# Patient Record
Sex: Female | Born: 1995 | Race: Black or African American | Hispanic: No | Marital: Single | Smoking: Never smoker
Health system: Southern US, Community
[De-identification: ages and names within clinical notes are randomized; demographics above are authoritative.]

## PROBLEM LIST (undated history)

## (undated) ENCOUNTER — Ambulatory Visit: Disposition: A | Discharge status: Other Acute Inpatient Hospital | Payer: Medicaid Other

## (undated) DIAGNOSIS — N920 Excessive and frequent menstruation with regular cycle: Secondary | ICD-10-CM

## (undated) DIAGNOSIS — N946 Dysmenorrhea, unspecified: Secondary | ICD-10-CM

## (undated) DIAGNOSIS — E669 Obesity, unspecified: Secondary | ICD-10-CM

## (undated) DIAGNOSIS — E66811 Obesity, class 1: Secondary | ICD-10-CM

## (undated) DIAGNOSIS — K219 Gastro-esophageal reflux disease without esophagitis: Secondary | ICD-10-CM

## (undated) HISTORY — DX: Dysmenorrhea, unspecified: N94.6

## (undated) HISTORY — DX: Obesity, unspecified: E66.9

## (undated) HISTORY — DX: Obesity, class 1: E66.811

## (undated) HISTORY — PX: WRIST SURGERY: SHX841

## (undated) HISTORY — DX: Gastro-esophageal reflux disease without esophagitis: K21.9

## (undated) HISTORY — DX: Excessive and frequent menstruation with regular cycle: N92.0

---
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
## 2016-08-02 ENCOUNTER — Ambulatory Visit (INDEPENDENT_AMBULATORY_CARE_PROVIDER_SITE_OTHER): Payer: Medicaid Other | Admitting: Certified Nurse Midwife

## 2016-08-02 ENCOUNTER — Encounter: Payer: Self-pay | Admitting: Certified Nurse Midwife

## 2016-08-02 VITALS — BP 110/70 | HR 83 | Ht 63.0 in | Wt 184.0 lb

## 2016-08-02 DIAGNOSIS — N921 Excessive and frequent menstruation with irregular cycle: Secondary | ICD-10-CM

## 2016-08-02 DIAGNOSIS — B9689 Other specified bacterial agents as the cause of diseases classified elsewhere: Secondary | ICD-10-CM | POA: Diagnosis not present

## 2016-08-02 DIAGNOSIS — B3731 Acute candidiasis of vulva and vagina: Secondary | ICD-10-CM

## 2016-08-02 DIAGNOSIS — R351 Nocturia: Secondary | ICD-10-CM

## 2016-08-02 DIAGNOSIS — N939 Abnormal uterine and vaginal bleeding, unspecified: Secondary | ICD-10-CM

## 2016-08-02 DIAGNOSIS — N760 Acute vaginitis: Secondary | ICD-10-CM

## 2016-08-02 DIAGNOSIS — N920 Excessive and frequent menstruation with regular cycle: Secondary | ICD-10-CM | POA: Insufficient documentation

## 2016-08-02 DIAGNOSIS — N898 Other specified noninflammatory disorders of vagina: Secondary | ICD-10-CM | POA: Diagnosis not present

## 2016-08-02 DIAGNOSIS — R350 Frequency of micturition: Secondary | ICD-10-CM

## 2016-08-02 DIAGNOSIS — B373 Candidiasis of vulva and vagina: Secondary | ICD-10-CM | POA: Diagnosis not present

## 2016-08-02 LAB — POCT URINALYSIS DIPSTICK
Bilirubin, UA: NEGATIVE
GLUCOSE UA: NEGATIVE
Ketones, UA: NEGATIVE
NITRITE UA: POSITIVE
PH UA: 7
Protein, UA: NEGATIVE
RBC UA: NEGATIVE
Spec Grav, UA: 1.01
UROBILINOGEN UA: NEGATIVE

## 2016-08-02 LAB — POCT URINE PREGNANCY: PREG TEST UR: NEGATIVE

## 2016-08-02 MED ORDER — METRONIDAZOLE 500 MG PO TABS
500.0000 mg | ORAL_TABLET | Freq: Two times a day (BID) | ORAL | 0 refills | Status: AC
Start: 2016-08-02 — End: 2016-08-09

## 2016-08-02 MED ORDER — FLUCONAZOLE 150 MG PO TABS: 150.0000 mg | ORAL_TABLET | ORAL | 0 refills | Status: AC

## 2016-08-02 NOTE — Progress Notes (Signed)
Subjective:   HPI  Yvonne Herrera is a 21 y.o. femaleG0 who presents with complaints of  Bleeding all the time since her Nexplanon was replaced in Dec 2016. Her bleeding is heavy at times requiring her to change her pad q1hour. The bleeding is accompanied by cramping and she has started to have cramping when she doesn't bleed. Ibuprofen does not help cramping.Has noticed a thick yellowish discharge when not bleeding. Has had some itching of vulva. Also complains of urinary frequency and nocturia "10 times/night." Denies dysuria. Denies drinking much fluid at night before bedtime.   Gynecologic History Patient's last menstrual period was 04/04/2016 (approximate). Contraception: Nexplanon. Has had the same sex partner for years.  Last Pap:  NA. Because of age  Obstetric History OB History  Gravida Para Term Preterm AB Living  0 0 0 0 0 0  SAB TAB Ectopic Multiple Live Births  0 0 0 0         Past Medical HX: No chronic medical illnesses Past Surgical Hx: No past surgery Social Hx: non smoker, does not drink alcohol Family history: MGM with colon cancer   Review of Systems   Review of Systems  Constitutional: Negative for fever.  Gastrointestinal: Positive for abdominal pain. Negative for constipation, diarrhea, nausea and vomiting.  Genitourinary: Positive for frequency. Negative for dysuria.       Positive for nocturia, vaginal discharge, metrorrhagia and menorrhagia  Neurological: Negative for weakness.      Objective:    BP 110/70   Pulse 83   Ht 5\' 3"  (1.6 m)   Wt 184 lb (83.5 kg)   LMP 04/04/2016 (Approximate) Comment: pt states she bled for 3 months  BMI 32.59 kg/m  General appearance: alert, cooperative and no distress Eyes: conjuctiva pink  Neck: no thyroid enlargement Abdomen: soft, non-tender; bowel sounds normal; no masses,  no organomegaly Pelvic: Vulva: labia majora appears irritated/ inflamed Vagina: white thin homogenous discharge with odor, vaginal  mucosa appears inflamed Cervix/ nullip, inflamed Uterus: RV, nssc, NT, mobility decreased Adnexa:no masses, NT Skin: small bruise above her Nexplanon inthe upper left arms   Wet prep was positive for clue cells and hyphae; neg for Trich UA dip positive for nitrites and small leukocytes Pregnancy test negative    Assessment:   Menometrorrhagia on the Nexplanon-R/O GC/Chlamydia Bacterial vaginosis Monilial vulvovaginitis Urinary frequency/nocturia   Plan:   Aptima Urine culture Given 2 samples of Taytulla to take continuously to help stabilize endometrium. Couseled on possible side effects, how to take pills, and risks of thromboembolism Flagyl 500 mgm po BID x7d ( no alcohol) Diflucan 150 mgm - take one every 3 days x2 doses Will call with positive lab result RTO in 2 months

## 2016-08-03 LAB — POCT WET PREP (WET MOUNT)
TRICHOMONAS WET PREP HPF POC: ABSENT
pH: 11

## 2016-08-03 LAB — GC/CHLAMYDIA PROBE AMP
CHLAMYDIA, DNA PROBE: NEGATIVE
Neisseria gonorrhoeae by PCR: NEGATIVE

## 2016-08-09 LAB — URINE CULTURE

## 2016-08-11 ENCOUNTER — Other Ambulatory Visit: Payer: Self-pay | Admitting: Certified Nurse Midwife

## 2016-08-11 DIAGNOSIS — N3000 Acute cystitis without hematuria: Secondary | ICD-10-CM

## 2016-08-11 MED ORDER — CIPROFLOXACIN HCL 500 MG PO TABS: 500.0000 mg | ORAL_TABLET | Freq: Two times a day (BID) | ORAL | 0 refills | Status: AC

## 2016-10-02 ENCOUNTER — Ambulatory Visit (INDEPENDENT_AMBULATORY_CARE_PROVIDER_SITE_OTHER): Payer: Medicaid Other | Admitting: Certified Nurse Midwife

## 2016-10-02 ENCOUNTER — Encounter: Payer: Self-pay | Admitting: Certified Nurse Midwife

## 2016-10-02 VITALS — BP 112/62 | HR 98 | Ht 65.0 in | Wt 187.0 lb

## 2016-10-02 DIAGNOSIS — Z3046 Encounter for surveillance of implantable subdermal contraceptive: Secondary | ICD-10-CM

## 2016-10-02 DIAGNOSIS — R350 Frequency of micturition: Secondary | ICD-10-CM

## 2016-10-02 DIAGNOSIS — Z3049 Encounter for surveillance of other contraceptives: Secondary | ICD-10-CM | POA: Diagnosis not present

## 2016-10-02 DIAGNOSIS — N921 Excessive and frequent menstruation with irregular cycle: Secondary | ICD-10-CM

## 2016-10-02 LAB — POCT URINALYSIS DIPSTICK
Bilirubin, UA: NEGATIVE
Glucose, UA: NEGATIVE
KETONES UA: NEGATIVE
Leukocytes, UA: NEGATIVE
Nitrite, UA: NEGATIVE
PH UA: 6 (ref 5.0–8.0)
UROBILINOGEN UA: NEGATIVE U/dL — AB

## 2016-10-02 MED ORDER — MEDROXYPROGESTERONE ACETATE 150 MG/ML IM SUSP: 150.0000 mg | INTRAMUSCULAR | 1 refills | Status: DC

## 2016-10-02 NOTE — Progress Notes (Signed)
Obstetrics & Gynecology Office Visit   Chief Complaint:  Chief Complaint  Patient presents with  . Follow-up    AUB    History of Present Illness: HPI  Yvonne Herrera is a 21 y.o. femaleG0 who presents with complaints of continued bleeding  since her Nexplanon was replaced in Dec 2016. She was seen [facility] and was placed on 2 months of Taytulla, but this has not stopped her bleeding.  Her bleeding is light to moderate flow, but at  times requires her to change her pad q1-2 hours. The bleeding is accompanied by cramping. She was treated for BV and monilia in March and her Chlamydia/gonorrhea cultures were negative at that time. She denies vaginitis sx today but has continued to have urinary frequency since being treated for a E.coli UTI in March.  Denies dysuria. Wants to have Nexplanon removed and is interested in another form of birth control.       Gynecologic History  Contraception: Nexplanon. Has had the same sex partner for years.  Last Pap:  NA. Because of age  Obstetric History        OB History  Gravida Para Term Preterm AB Living  0 0 0 0 0 0  SAB TAB Ectopic Multiple Live Births  0 0 0 0         Past Medical HX: No chronic medical illnesses Past Surgical Hx: No past surgery Social Hx: non smoker, does not drink alcohol Family history: MGM with colon cancer     Review of Systems:  Review of Systems  Constitutional: Negative for chills and fever.  Gastrointestinal: Positive for abdominal pain (cramping).  Genitourinary: Positive for frequency. Negative for dysuria.       See HPI     Past Medical History:  History reviewed. No pertinent past medical history.  Past Surgical History:  History reviewed. No pertinent surgical history.  Gynecologic History: No LMP recorded (lmp unknown). Patient has had an implant.  Obstetric History: G0P0000  Family History:  Family History  Problem Relation Age of Onset  . Colon cancer Maternal Grandmother      Social History:  Social History   Social History  . Marital status: Single    Spouse name: N/A  . Number of children: N/A  . Years of education: N/A   Occupational History  . Not on file.   Social History Main Topics  . Smoking status: Never Smoker  . Smokeless tobacco: Never Used  . Alcohol use No  . Drug use: No  . Sexual activity: Yes    Partners: Male    Birth control/ protection: Implant   Other Topics Concern  . Not on file   Social History Narrative  . No narrative on file    Allergies:  No Known Allergies  Medications: Prior to Admission medications   Medication Sig Start Date End Date Taking? Authorizing Provider  etonogestrel (NEXPLANON) 68 MG IMPL implant 1 each by Subdermal route once.    Historical Provider, MD    Physical Exam Vitals: BP 112/62   Pulse 98   Ht  (1.651 m)   Wt 84.8 kg (187 lb)   LMP  (LMP Unknown) Comment: pt has had continous bleeding with nexplanon  BMI 31.12 kg/m    Physical Exam  Constitutional: She is oriented to person, place, and time. She appears well-developed and well-nourished. No distress.  GI: Soft. She exhibits no mass. There is no tenderness. There is no guarding.  Genitourinary:  Genitourinary Comments: Vulva: no lesions or inflammation VAgina: small amt blood in vault Cervix: small amt bloody mucous at cervical os, closed, NT Uterus: Sharply RV, NT, difficult to outline but does not feel enlarged Adnexa: no masses, NT  Neurological: She is alert and oriented to person, place, and time.  Skin: Skin is warm and dry.  Psychiatric: She has a normal mood and affect. Her behavior is normal. Thought content normal.     Results for orders placed or performed in visit on 10/02/16 (from the past 48 hour(s))  POCT Urinalysis Dipstick     Status: Abnormal   Collection Time: 10/02/16 10:19 AM  Result Value Ref Range   Color, UA yellow    Clarity, UA cloudy    Glucose, UA neg    Bilirubin, UA neg     Ketones, UA neg    Spec Grav, UA >=1.030 (A) 1.010 - 1.025   Blood, UA large    pH, UA 6.0 5.0 - 8.0   Protein, UA trace    Urobilinogen, UA negative (A) 0.2 or 1.0 E.U./dL   Nitrite, UA neg    Leukocytes, UA Negative Negative    Assessment: 21 y.o. G0P0000 with almost continuous bleeding on the Nexplanon, not responsive to BCPs  Desires removal Contraceptive counseling Urinary frequency s/p treatment for UTI  Plan:   Nexplanon removal discussed in detail.  Risks of infection, bleeding, nerve injury all reviewed.  Patient understands risks and desires to proceed. Consent obtained and scanned into chart.  Patient is certain she wants the implanon removed.  All questions answered.  Procedure: Patient placed in dorsal supine with left arm above head, elbow flexed at 90 degrees, arm resting on examination table. Nexplanon identified without problems.  Betadine scrub x3.  1 ml of 2% lidocaine injected under implanon device without problems.  Sterile gloves applied.  Small 0.5cm incision made at distal tip of Nexplanon device with 11 blade scalpel. Nexplanon brought to incision with some difficulty and grasped with a small hemastat.  The implant was  removed intact without problems.  Pressure applied to incision.  Hemostasis obtained.  Steri-strips applied, followed by bandage and compression dressing.  Patient tolerated procedure well.  No complications.  Patient given post procedure precautions and asked to call for fever, chills, redness or drainage from her incision, bleeding from incision.  She understands she will likely have a small bruise near site of removal and can remove compression dressing tomorrow and steri-strips in approximately 5-7 week. Advised to change Band aid daily  2) Contraception-discussed all contraception options. She would like to use Depo Provera, but would like to start OCPs to see if her bleeding will stop now that Nexplanon has been removed. Will return in 3-4 weeks  for the Depo injection. Aware of risks or irregular bleeding on the Depo. RX for Depo Provera given. To use barrier contraception until she is thru the first week of pills  3) Urine culture  Farrel Conners, CNM

## 2016-10-04 ENCOUNTER — Other Ambulatory Visit: Payer: Self-pay | Admitting: Certified Nurse Midwife

## 2016-10-04 DIAGNOSIS — Z131 Encounter for screening for diabetes mellitus: Secondary | ICD-10-CM

## 2016-10-04 LAB — URINE CULTURE

## 2016-10-05 ENCOUNTER — Other Ambulatory Visit: Payer: Medicaid Other

## 2016-10-25 ENCOUNTER — Ambulatory Visit: Payer: Medicaid Other

## 2016-10-26 ENCOUNTER — Other Ambulatory Visit: Payer: Self-pay

## 2016-10-26 ENCOUNTER — Telehealth: Payer: Self-pay | Admitting: Certified Nurse Midwife

## 2016-10-26 MED ORDER — MEDROXYPROGESTERONE ACETATE 150 MG/ML IM SUSP: 150.0000 mg | INTRAMUSCULAR | 1 refills | Status: DC

## 2016-10-26 NOTE — Telephone Encounter (Signed)
Returned call: had Nexplanon removed 5/1 and started on Taytulla daily-is on her third week of pills and started bleeding heavily yesterday and passing clots. Changing her pad every 20 minutes. Is also having cramping not relieved with ibuprofen 800 mgm , Midol or Tylenol. Recommended doubling up on her BCPs until the bleeding slows and starting 2 Aleve BID. If bleeding remains heavy, recommend going to ER.

## 2016-10-26 NOTE — Telephone Encounter (Signed)
Pt is calling due to prescription for depo is  Not at pharmacy. Pt is schedule 11/02/16 for injection. Please contact pt.

## 2016-10-26 NOTE — Telephone Encounter (Signed)
Pt aware I refilled medication. She states she would like [facility] to call her back because she is having really bad cramping and large clots with bleeding that started yesterday. Pt states [facility] is aware of her condition. KJ CMA

## 2016-11-01 ENCOUNTER — Other Ambulatory Visit: Payer: Self-pay | Admitting: Obstetrics and Gynecology

## 2016-11-01 MED ORDER — MEDROXYPROGESTERONE ACETATE 150 MG/ML IM SUSP: 150.0000 mg | INTRAMUSCULAR | 1 refills | Status: DC

## 2016-11-02 ENCOUNTER — Ambulatory Visit (INDEPENDENT_AMBULATORY_CARE_PROVIDER_SITE_OTHER): Payer: Medicaid Other

## 2016-11-02 DIAGNOSIS — Z3202 Encounter for pregnancy test, result negative: Secondary | ICD-10-CM | POA: Diagnosis not present

## 2016-11-02 DIAGNOSIS — N926 Irregular menstruation, unspecified: Secondary | ICD-10-CM

## 2016-11-02 LAB — POCT URINE PREGNANCY: Preg Test, Ur: NEGATIVE

## 2016-11-02 NOTE — Patient Instructions (Signed)
Pt 's last menses was scant.  She has had unprotected intercourse multiple times since having Nexplanon removed.  Pt informed of results of urine pregnancy test.  She requests to start Depo with next cycle.  Pt was cautioned against unprotected intercourse.  Pt desires to wait and administer injection herself in the future.  She will follow up prn sx.

## 2016-12-07 ENCOUNTER — Encounter: Payer: Medicaid Other | Admitting: Advanced Practice Midwife

## 2016-12-27 ENCOUNTER — Encounter: Payer: Self-pay | Admitting: Emergency Medicine

## 2016-12-27 ENCOUNTER — Emergency Department
Admission: EM | Admit: 2016-12-27 | Discharge: 2016-12-27 | Disposition: A | Discharge status: Home / Self Care | Payer: Worker's Compensation | Attending: Emergency Medicine | Admitting: Emergency Medicine

## 2016-12-27 ENCOUNTER — Emergency Department: Payer: Worker's Compensation

## 2016-12-27 DIAGNOSIS — S0083XA Contusion of other part of head, initial encounter: Secondary | ICD-10-CM | POA: Diagnosis not present

## 2016-12-27 DIAGNOSIS — Y92199 Unspecified place in other specified residential institution as the place of occurrence of the external cause: Secondary | ICD-10-CM | POA: Insufficient documentation

## 2016-12-27 DIAGNOSIS — Y999 Unspecified external cause status: Secondary | ICD-10-CM | POA: Diagnosis not present

## 2016-12-27 DIAGNOSIS — W500XXA Accidental hit or strike by another person, initial encounter: Secondary | ICD-10-CM | POA: Diagnosis not present

## 2016-12-27 DIAGNOSIS — M6281 Muscle weakness (generalized): Secondary | ICD-10-CM | POA: Insufficient documentation

## 2016-12-27 DIAGNOSIS — Y9389 Activity, other specified: Secondary | ICD-10-CM | POA: Insufficient documentation

## 2016-12-27 DIAGNOSIS — S0080XA Unspecified superficial injury of other part of head, initial encounter: Secondary | ICD-10-CM | POA: Diagnosis present

## 2016-12-27 MED ORDER — CYCLOBENZAPRINE HCL 10 MG PO TABS: 10.0000 mg | ORAL_TABLET | Freq: Three times a day (TID) | ORAL | 0 refills | Status: DC | PRN

## 2016-12-27 MED ORDER — ORPHENADRINE CITRATE 30 MG/ML IJ SOLN
60.0000 mg | Freq: Two times a day (BID) | INTRAMUSCULAR | Status: DC
  Administered 2016-12-27: 60 mg via INTRAMUSCULAR
  Filled 2016-12-27: qty 2

## 2016-12-27 MED ORDER — IBUPROFEN 600 MG PO TABS: 600.0000 mg | ORAL_TABLET | Freq: Three times a day (TID) | ORAL | 0 refills | Status: DC | PRN

## 2016-12-27 MED ORDER — TRAMADOL HCL 50 MG PO TABS
50.0000 mg | ORAL_TABLET | Freq: Once | ORAL | Status: DC
  Filled 2016-12-27: qty 1

## 2016-12-27 MED ORDER — KETOROLAC TROMETHAMINE 60 MG/2ML IM SOLN
60.0000 mg | Freq: Once | INTRAMUSCULAR | Status: AC
  Administered 2016-12-27: 60 mg via INTRAMUSCULAR
  Filled 2016-12-27: qty 2

## 2016-12-27 MED ORDER — IBUPROFEN 600 MG PO TABS
600.0000 mg | ORAL_TABLET | Freq: Once | ORAL | Status: DC
  Filled 2016-12-27: qty 1
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
## 2016-12-27 NOTE — ED Notes (Signed)
Pt talks with ease, pt able to use both hands appropriately.

## 2016-12-27 NOTE — ED Notes (Signed)
Pt alert and oriented X4, active, cooperative, pt in NAD. RR even and unlabored, color WNL.  Pt informed to return if any life threatening symptoms occur.   

## 2016-12-27 NOTE — ED Notes (Signed)
The worker's compensation urine drug screen was completed and sent to the lab.

## 2017-01-21 ENCOUNTER — Other Ambulatory Visit: Payer: Self-pay | Admitting: Physician Assistant

## 2017-01-22 ENCOUNTER — Ambulatory Visit: Payer: Medicaid Other

## 2017-01-23 MED ORDER — IBUPROFEN 600 MG PO TABS: 600.0000 mg | ORAL_TABLET | Freq: Four times a day (QID) | ORAL | 0 refills | Status: DC | PRN

## 2017-02-24 ENCOUNTER — Other Ambulatory Visit: Payer: Self-pay | Admitting: Physician Assistant

## 2017-02-26 ENCOUNTER — Encounter: Payer: Self-pay | Admitting: Certified Nurse Midwife

## 2017-03-28 ENCOUNTER — Other Ambulatory Visit: Payer: Self-pay | Admitting: Obstetrics and Gynecology

## 2017-04-01 ENCOUNTER — Telehealth: Payer: Self-pay

## 2017-04-01 MED ORDER — MEDROXYPROGESTERONE ACETATE 150 MG/ML IM SUSP: 150.0000 mg | INTRAMUSCULAR | 0 refills | Status: DC

## 2017-04-01 NOTE — Telephone Encounter (Signed)
Pt cancelled Depo appointment in August. Please advise

## 2017-04-01 NOTE — Telephone Encounter (Signed)
Pt calling c question re depo.  States pharm needs rx to refill and has sent request but hasn't gotten a response.  Pt states inj is due in a couple of days. [phone] Called pharm to verify.  Pharmacist states depo was p/u 5/31 and 8/20.  Depo refilled x1 c note that pt needs annual exam. VM not set up yet.

## 2017-04-04 NOTE — Telephone Encounter (Signed)
Patient seems to be best known by Yvonne Herrera.  Forwarding to her for her opinion. [facility] But every interaction has been with her this year.

## 2017-04-05 MED ORDER — MEDROXYPROGESTERONE ACETATE 150 MG/ML IM SUSP: 150.0000 mg | INTRAMUSCULAR | 1 refills | Status: DC

## 2017-04-05 NOTE — Telephone Encounter (Signed)
Talked with patient. Saw her in March and May of this year. Will send in refill of her Depo x 2. Was instructed how to  give herself injections in her buttock Advised to reschedule annual to March of next year.

## 2017-04-17 ENCOUNTER — Encounter: Payer: Self-pay | Admitting: Certified Nurse Midwife

## 2017-04-17 ENCOUNTER — Ambulatory Visit (INDEPENDENT_AMBULATORY_CARE_PROVIDER_SITE_OTHER): Payer: Medicaid Other | Admitting: Certified Nurse Midwife

## 2017-04-17 VITALS — BP 122/80 | HR 97 | Ht 64.0 in | Wt 202.0 lb

## 2017-04-17 DIAGNOSIS — Z8742 Personal history of other diseases of the female genital tract: Secondary | ICD-10-CM

## 2017-04-17 DIAGNOSIS — Z01419 Encounter for gynecological examination (general) (routine) without abnormal findings: Secondary | ICD-10-CM

## 2017-04-17 DIAGNOSIS — Z309 Encounter for contraceptive management, unspecified: Secondary | ICD-10-CM | POA: Diagnosis not present

## 2017-04-17 DIAGNOSIS — N912 Amenorrhea, unspecified: Secondary | ICD-10-CM

## 2017-04-17 DIAGNOSIS — R102 Pelvic and perineal pain: Secondary | ICD-10-CM

## 2017-04-17 DIAGNOSIS — Z3202 Encounter for pregnancy test, result negative: Secondary | ICD-10-CM

## 2017-04-17 DIAGNOSIS — Z1389 Encounter for screening for other disorder: Secondary | ICD-10-CM | POA: Diagnosis not present

## 2017-04-17 DIAGNOSIS — Z113 Encounter for screening for infections with a predominantly sexual mode of transmission: Secondary | ICD-10-CM

## 2017-04-17 DIAGNOSIS — R350 Frequency of micturition: Secondary | ICD-10-CM

## 2017-04-17 LAB — POCT URINALYSIS DIPSTICK
BILIRUBIN UA: NEGATIVE
GLUCOSE UA: NEGATIVE
KETONES UA: NEGATIVE
Leukocytes, UA: NEGATIVE
NITRITE UA: NEGATIVE
PH UA: 6.5 (ref 5.0–8.0)
SPEC GRAV UA: 1.015 (ref 1.010–1.025)
Urobilinogen, UA: 0.2 E.U./dL

## 2017-04-17 LAB — POCT URINE PREGNANCY: PREG TEST UR: NEGATIVE

## 2017-04-17 MED ORDER — MEDROXYPROGESTERONE ACETATE 150 MG/ML IM SUSP
150.0000 mg | INTRAMUSCULAR | 3 refills | Status: DC
Start: 2017-04-17 — End: 2017-10-02

## 2017-04-17 MED ORDER — PRENATAL VITAMINS 0.8 MG PO TABS: 1.0000 | ORAL_TABLET | Freq: Every day | ORAL | 12 refills | Status: DC

## 2017-04-17 MED ORDER — NAPROXEN SODIUM 550 MG PO TABS: ORAL_TABLET | ORAL | 5 refills | Status: DC

## 2017-04-17 MED ORDER — ONDANSETRON 8 MG PO TBDP: 8.0000 mg | ORAL_TABLET | Freq: Three times a day (TID) | ORAL | 1 refills | Status: DC | PRN

## 2017-04-17 NOTE — Progress Notes (Signed)
Gynecology Annual Exam  PCP: Patient, No Pcp Per  Chief Complaint:  Chief Complaint  Patient presents with  . Gynecologic Exam    Talk about different type of birth control    History of Present Illness: Yvonne Herrera is a 21 y.o. G0P0000 presents for her annual gyn exam and to discuss changing method of contraception The patient has a history of extremely painful and heavy menses since her menarche at age 21. She states the pain was not relieved with OTC analgesics. She also had nausea and vomiting with her menses. The periods would incapacitate her and she would miss a week of school each month. She was on Depo Provera in treatment of her dysmenorrhea and heavy bleeding, which did result in amenorrhea. She then had a Nexplanon inserted but began to have bleeding after 2+ years that did not resolve with replacing the Nexplanon. The Nexplanon was removed and she was placed on OCPs, but she bled all the time on the pills and she then decided to go back on Depo. She has had 2 doses of Depo and has stopped bleeding, but she still has pelvic pain/ cramping prior to her Depo injection.  She has gained a great deal of weight on the Nexplanon and Depo, with an 18# weight gain in 8 months. She would like to stop BC in order to conceive, but is scared about having the heavy painful menses return.  Is currently in a M/M relationship x 2 years.  Last pap smear: NA, due to age.   Since her last visit, she has had no other significant changes in her health.  Her past medical history is remarkable for dysmenorrhea and menorrhagia.  The patient does not perform self breast exams. Her last mammogram was NA  There is a family history of breast cancer in her maternal great aunts x 2. Genetic testing has not been done   There is no family history of ovarian cancer.   The patient denies smoking.  She denies drinking alcohol.   She denies illegal drug use.  The patient does not exercise.  The  patient denies current symptoms of depression.    Review of Systems: Review of Systems  Constitutional: Negative for chills, fever and weight loss.       Positive for weight gain  HENT: Negative for congestion, sinus pain and sore throat.   Eyes: Negative for blurred vision and pain.  Respiratory: Negative for hemoptysis, shortness of breath and wheezing.   Cardiovascular: Negative for chest pain, palpitations and leg swelling.  Gastrointestinal: Positive for abdominal pain. Negative for blood in stool, diarrhea, heartburn, nausea and vomiting.       Positive for lower abdominal cramping intermittently  Genitourinary: Positive for frequency. Negative for dysuria, hematuria and urgency.       Had bad odor to urine, nocturia and urinary frequency in the past few weeks. Symptoms resolved except for urinary frequency  Musculoskeletal: Negative for back pain, joint pain and myalgias.  Skin: Negative for itching and rash.  Neurological: Negative for dizziness, tingling and headaches.  Endo/Heme/Allergies: Negative for environmental allergies and polydipsia. Does not bruise/bleed easily.       Negative for hirsutism. Positive for left breast tenderness   Psychiatric/Behavioral: Negative for depression. The patient is not nervous/anxious and does not have insomnia.     Past Medical History:   Past Surgical History:  Past Surgical History:  Procedure Laterality Date  . nexplanon  10/25/2012   RPH  Family History:  Family History  Problem Relation Age of Onset  . Colon cancer Maternal Grandmother   . Hypertension Maternal Grandfather   . Heart disease Maternal Grandfather   . Kidney failure Maternal Grandfather   . Hypertension Paternal Grandmother   . Hypertension Maternal Uncle   . Stroke Maternal Uncle   . Stroke Other   . Breast cancer Other        2 maternal great aunts with breast cancer 3(50s) and a maternal great aunt with colon cancer (65)  . Stomach cancer Other      Social History:  Social History   Socioeconomic History  . Marital status: Single    Spouse name: Not on file  . Number of children: 0  . Years of education: Not on file  . Highest education level: Not on file  Social Needs  . Financial resource strain: Not on file  . Food insecurity - worry: Not on file  . Food insecurity - inability: Not on file  . Transportation needs - medical: Not on file  . Transportation needs - non-medical: Not on file  Occupational History  . Occupation: Patient care assistant  Tobacco Use  . Smoking status: Never Smoker  . Smokeless tobacco: Never Used  Substance and Sexual Activity  . Alcohol use: No  . Drug use: No  . Sexual activity: Yes    Partners: Male    Birth control/protection: Injection    Comment: Depo  Other Topics Concern  . Not on file  Social History Narrative  . Not on file    Allergies:  No Known Allergies  Medications: Depo provera 150 mgm IM q12 weeks.  Physical Exam Vitals: BP 122/80   Pulse 97   Ht 5\' 4"  (1.626 m)   Wt 202 lb (91.6 kg)   LMP  (LMP Unknown)   BMI 34.67 kg/m   General: BF in NAD HEENT: normocephalic, anicteric Neck: no thyroid enlargement, no palpable nodules, no cervical lymphadenopathy  Pulmonary: No increased work of breathing, CTAB Cardiovascular: RRR, without murmur  Breast: Breast symmetrical, no tenderness, no palpable nodules or masses, no skin or nipple retraction present, no nipple discharge.  No axillary, infraclavicular or supraclavicular lymphadenopathy. Abdomen: Soft, non-tender, non-distended.  Umbilicus without lesions.  No hepatomegaly or masses palpable. No evidence of hernia. Genitourinary:  External: Normal external female genitalia.  Normal urethral meatus, normal Bartholin's and Skene's glands.    Vagina: Normal vaginal mucosa, no evidence of prolapse.    Cervix:  no bleeding, non-tender  Uterus: Retroverted, normal size, shape, and consistency, decreased mobility, and  non-tender  Adnexa: No adnexal masses, non-tender  Rectal: deferred  Lymphatic: no evidence of inguinal lymphadenopathy Extremities: no edema, erythema, or tenderness Neurologic: Grossly intact Psychiatric: mood appropriate, affect full  Results for orders placed or performed in visit on 04/17/17 (from the past 24 hour(s))  POCT urine pregnancy     Status: Normal   Collection Time: 04/17/17  3:21 PM  Result Value Ref Range   Preg Test, Ur Negative Negative  POCT Urinalysis Dipstick     Status: Normal   Collection Time: 04/17/17  3:23 PM  Result Value Ref Range   Color, UA Yellow    Clarity, UA clear    Glucose, UA negative    Bilirubin, UA negative    Ketones, UA negative    Spec Grav, UA 1.015 1.010 - 1.025   Blood, UA 5-10    pH, UA 6.5 5.0 - 8.0   Protein,  UA trace    Urobilinogen, UA 0.2 0.2 or 1.0 E.U./dL   Nitrite, UA negative    Leukocytes, UA Negative Negative     Assessment: 21 y.o. G0P0000 annual gyn exam Hx of dysmenorrhea/ menorrhagia/ pelvic pain Considering discontinuing contraception and trying to conceive. Urinary frequency-normal urine dipstick   Plan:   1) Breast cancer screening - recommend monthly self breast exam.   2) STI screening for GC and Chlamydia was done  3) Cervical cancer screening - Not done. Start pap smears at age 66  4) Contraception - refilled Depo. Patient giving her own injections. Advised to make sure she is giving the med on time and in correct manner.  Recommend starting prenatal vitamins before stopping Depo. RX for Naprasyn and zofran for pain and nausea with menses sent to pharmacy. Offered to schedule pelvic ultrasound. Patient's insurance will not cover. Unsure if she can afford test.  5) RTO 1 year and prn  Farrel Conners, CNM

## 2017-04-18 ENCOUNTER — Encounter: Payer: Self-pay | Admitting: Certified Nurse Midwife

## 2017-04-18 DIAGNOSIS — Z8742 Personal history of other diseases of the female genital tract: Secondary | ICD-10-CM | POA: Insufficient documentation

## 2017-04-19 LAB — CHLAMYDIA/GONOCOCCUS/TRICHOMONAS, NAA
CHLAMYDIA BY NAA: NEGATIVE
Gonococcus by NAA: NEGATIVE
TRICH VAG BY NAA: NEGATIVE

## 2017-04-22 ENCOUNTER — Encounter: Payer: Self-pay | Admitting: Certified Nurse Midwife
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
## 2017-10-02 ENCOUNTER — Encounter: Payer: Self-pay | Admitting: Certified Nurse Midwife

## 2017-10-02 ENCOUNTER — Ambulatory Visit (INDEPENDENT_AMBULATORY_CARE_PROVIDER_SITE_OTHER): Payer: Medicaid Other | Admitting: Certified Nurse Midwife

## 2017-10-02 VITALS — BP 114/64 | HR 93 | Ht 64.0 in | Wt 202.0 lb

## 2017-10-02 DIAGNOSIS — N939 Abnormal uterine and vaginal bleeding, unspecified: Secondary | ICD-10-CM

## 2017-10-02 LAB — POCT URINE PREGNANCY: PREG TEST UR: NEGATIVE

## 2017-10-02 NOTE — Progress Notes (Signed)
Obstetrics & Gynecology Office Visit   Chief Complaint:  Chief Complaint  Patient presents with  . Menstrual Problem    abnormal bleeding     History of Present Illness: 22 year old [place] female who presents with complaints of having "pregnancy symptoms." Has been on Depo Provera and recently stopped the Depo in hopse of conceiving. Last dose of Depo was in December. She has had only spotting intermittently since she stopped the Depo. Last time she spotted was last week. She also complains of nausea and reflux x 1 mos and some mid intermittent abdominal pain. No foods seem to bring on the nausea or abdominal pain. Last BM was yesterday and was normal. Denies vomiting, fever, dysuria, hematuria. Not taking anything for reflux or heartburn. Takes Zofran for nausea. Has breast tenderness intermittently. Has not done a pregnancy test at home   Review of Systems:  Review of Systems  Constitutional: Positive for malaise/fatigue. Negative for chills, fever and weight loss.  HENT: Negative for congestion, sinus pain and sore throat.   Eyes: Negative for blurred vision and pain.  Respiratory: Negative for hemoptysis, shortness of breath and wheezing.   Cardiovascular: Negative for chest pain, palpitations and leg swelling.  Gastrointestinal: Positive for heartburn (and reflux) and nausea. Negative for abdominal pain, blood in stool, diarrhea and vomiting.  Genitourinary: Positive for frequency. Negative for dysuria, hematuria and urgency.  Musculoskeletal: Positive for back pain (lumbar area). Negative for joint pain and myalgias.  Skin: Negative for itching and rash.  Neurological: Positive for headaches. Negative for dizziness and tingling.  Endo/Heme/Allergies: Negative for environmental allergies and polydipsia. Does not bruise/bleed easily.       Negative for hirsutism   Psychiatric/Behavioral: Negative for depression. The patient is not nervous/anxious and does not have insomnia.        Past Medical History:  Past Medical History:  Diagnosis Date  . Dysmenorrhea   . Menorrhagia   . Obesity (BMI 30.0-34.9)     Past Surgical History:  Past Surgical History:  Procedure Laterality Date  . nexplanon  10/25/2012   RPH     Gynecologic History: No LMP recorded. (Menstrual status: Irregular Periods).  Obstetric History: G0P0000  Family History:  Family History  Problem Relation Age of Onset  . Colon cancer Maternal Grandmother   . Hypertension Maternal Grandfather   . Heart disease Maternal Grandfather   . Kidney failure Maternal Grandfather   . Hypertension Paternal Grandmother   . Hypertension Maternal Uncle   . Stroke Maternal Uncle   . Stroke Other   . Breast cancer Other        2 maternal great aunts with breast cancer (29s) and a maternal great aunt with colon cancer (65)  . Stomach cancer Other     Social History:  Social History   Socioeconomic History  . Marital status: Single    Spouse name: Not on file  . Number of children: 0  . Years of education: Not on file  . Highest education level: Not on file  Occupational History  . Occupation: Patient care assistant  Social Needs  . Financial resource strain: Not on file  . Food insecurity:    Worry: Not on file    Inability: Not on file  . Transportation needs:    Medical: Not on file    Non-medical: Not on file  Tobacco Use  . Smoking status: Never Smoker  . Smokeless tobacco: Never Used  Substance and Sexual Activity  .  Alcohol use: No  . Drug use: No  . Sexual activity: Yes    Partners: Male    Birth control/protection: None  Lifestyle  . Physical activity:    Days per week: 0 days    Minutes per session: 0 min  . Stress: Not at all  Relationships  . Social connections:    Talks on phone: Once a week    Gets together: Once a week    Attends religious service: Never    Active member of club or organization: No    Attends meetings of clubs or organizations: Never     Relationship status: Living with partner  . Intimate partner violence:    Fear of current or ex partner: No    Emotionally abused: No    Physically abused: No    Forced sexual activity: No  Other Topics Concern  . Not on file  Social History Narrative  . Not on file    Allergies:  No Known Allergies  Medications: Prior to Admission medications   Medication Sig Start Date End Date Taking? Authorizing Provider  cyclobenzaprine (FLEXERIL) 10 MG tablet Take 1 tablet (10 mg total) by mouth 3 (three) times daily as needed. 12/27/16  Yes Joni Reining, PA-C  naproxen sodium (ANAPROX) 550 MG tablet Take one BID prn menstrual cramping 04/17/17  Yes Farrel Conners, CNM  ondansetron (ZOFRAN ODT) 8 MG disintegrating tablet Take 1 tablet (8 mg total) every 8 (eight) hours as needed by mouth for nausea or vomiting. 04/17/17  Yes Farrel Conners, CNM  Prenatal Vit-Fe Fumarate-FA (PREPLUS) 27-1 MG TABS  09/23/17  Yes [provider]    Physical Exam Vitals:BP 114/64   Pulse 93   Ht  (1.626 m)   Wt 91.6 kg (202 lb)   BMI 34.67 kg/m  Physical Exam  Constitutional: She is oriented to person, place, and time. She appears well-developed and well-nourished. No distress.  Eyes: Conjunctivae are normal. No scleral icterus.  Cardiovascular: Normal rate.  Respiratory: Effort normal.  GI: Soft. Bowel sounds are normal. She exhibits no distension and no mass. There is no tenderness. There is no guarding.  Musculoskeletal: Normal range of motion.  Neurological: She is alert and oriented to person, place, and time.  Skin: Skin is warm and dry.  Psychiatric: She has a normal mood and affect. Her behavior is normal.   Results for orders placed or performed in visit on 10/02/17 (from the past 24 hour(s))  POCT urine pregnancy     Status: Normal   Collection Time: 10/02/17 11:21 AM  Result Value Ref Range   Preg Test, Ur Negative Negative    Assessment: 22 y.o. G0P0000 recently  discontinued Depo and having some spotting, nausea With a negative urine  pregnancy test  Plan: Patient reminded that it may take months for her menstrual cycles to resume after being on Depo Provera Will get serum beta HCG to confirm that not pregnant If symptoms persist and not pregnant-recommend seeing PCP.  Farrel Conners, CNM

## 2017-10-03 LAB — BETA HCG QUANT (REF LAB)

## 2017-11-20 ENCOUNTER — Telehealth: Payer: Self-pay

## 2017-11-20 NOTE — Telephone Encounter (Signed)
Pt hasn't had a period in 2 months. Faint + on 2 hpt's. Pt has a few questions. [phone]#[phone]

## 2017-11-20 NOTE — Telephone Encounter (Signed)
Advised pt to schedule appt for NOB for pregnancy confirmation. Transferred to front desk for scheduling.

## 2017-11-26 ENCOUNTER — Encounter: Payer: Self-pay | Admitting: Obstetrics and Gynecology

## 2017-12-04 ENCOUNTER — Encounter: Payer: Self-pay | Admitting: Obstetrics and Gynecology

## 2017-12-04 ENCOUNTER — Ambulatory Visit (INDEPENDENT_AMBULATORY_CARE_PROVIDER_SITE_OTHER): Payer: Medicaid Other | Admitting: Obstetrics and Gynecology

## 2017-12-04 ENCOUNTER — Other Ambulatory Visit: Payer: Self-pay | Admitting: Obstetrics and Gynecology

## 2017-12-04 VITALS — BP 120/72 | Ht 64.0 in | Wt 205.0 lb

## 2017-12-04 DIAGNOSIS — N912 Amenorrhea, unspecified: Secondary | ICD-10-CM

## 2017-12-04 DIAGNOSIS — N926 Irregular menstruation, unspecified: Secondary | ICD-10-CM

## 2017-12-04 DIAGNOSIS — Z3202 Encounter for pregnancy test, result negative: Secondary | ICD-10-CM

## 2017-12-04 LAB — POCT URINE PREGNANCY: PREG TEST UR: NEGATIVE

## 2017-12-04 NOTE — Progress Notes (Signed)
Patient ID: Yvonne Herrera, female   DOB: 12-Sep-1995, 22 y.o.   MRN: [id number]  Reason for Consult: No chief complaint on file.   Referred by No ref. provider found  Subjective:     HPI:  Yvonne Herrera is a 22 y.o. female  She presents today for a new OB visit, but her urine pregnancy test was negative today in office. She reports that she had a positive pregnancy test at home about two weeks ago. Her last menstrual period was two months ago. She does desire pregnancy.  She has been taking a prenatal vitamin. She reports otherwise monthly menstrual periods. She recently stopped Depo Provera. She reports that her last dose of Depo was in October of 2019.   Past Medical History:  Diagnosis Date  . Dysmenorrhea   . Menorrhagia   . Obesity (BMI 30.0-34.9)    Family History  Problem Relation Age of Onset  . Colon cancer Maternal Grandmother   . Hypertension Maternal Grandfather   . Heart disease Maternal Grandfather   . Kidney failure Maternal Grandfather   . Hypertension Paternal Grandmother   . Hypertension Maternal Uncle   . Stroke Maternal Uncle   . Stroke Other   . Breast cancer Other        2 maternal great aunts with breast cancer 58(50s) and a maternal great aunt with colon cancer (65)  . Stomach cancer Other    Past Surgical History:  Procedure Laterality Date  . nexplanon  10/25/2012   RPH     Short Social History:  Social History   Tobacco Use  . Smoking status: Never Smoker  . Smokeless tobacco: Never Used  Substance Use Topics  . Alcohol use: No    No Known Allergies  Current Outpatient Medications  Medication Sig Dispense Refill  . cyclobenzaprine (FLEXERIL) 10 MG tablet Take 1 tablet (10 mg total) by mouth 3 (three) times daily as needed. 15 tablet 0  . naproxen sodium (ANAPROX) 550 MG tablet Take one BID prn menstrual cramping 30 tablet 5  . ondansetron (ZOFRAN ODT) 8 MG disintegrating tablet Take 1 tablet (8 mg total) every 8 (eight) hours as  needed by mouth for nausea or vomiting. 30 tablet 1  . Prenatal Vit-Fe Fumarate-FA (PREPLUS) 27-1 MG TABS   12   No current facility-administered medications for this visit.     Review of Systems  Constitutional: Negative for chills, fatigue, fever and unexpected weight change.  HENT: Negative for trouble swallowing.  Eyes: Negative for loss of vision.  Respiratory: Negative for cough, shortness of breath and wheezing.  Cardiovascular: Negative for chest pain, leg swelling, palpitations and syncope.  GI: Negative for abdominal pain, blood in stool, diarrhea, nausea and vomiting.  GU: Negative for difficulty urinating, dysuria, frequency and hematuria.  Musculoskeletal: Negative for back pain, leg pain and joint pain.  Skin: Negative for rash.  Neurological: Negative for dizziness, headaches, light-headedness, numbness and seizures.  Psychiatric: Negative for behavioral problem, confusion, depressed mood and sleep disturbance.        Objective:  Objective   There were no vitals filed for this visit. There is no height or weight on file to calculate BMI.  Physical Exam  Constitutional: She is oriented to person, place, and time. She appears well-developed and well-nourished.  HENT:  Head: Normocephalic and atraumatic.  Eyes: EOM are normal.  Cardiovascular: Normal rate, regular rhythm and normal heart sounds.  Pulmonary/Chest: Effort normal and breath sounds normal.  Neurological: She  is alert and oriented to person, place, and time.  Skin: Skin is warm and dry.  Psychiatric: She has a normal mood and affect. Her behavior is normal. Judgment and thought content normal.  Nursing note and vitals reviewed.      Assessment/Plan:     22 yo G0P0000 with amenorrhea, positive home pregnancy test.  Will obtain a beta hcg.  She will follow up in 1 week.   Adelene Idler MD [facility], [facility] 12/04/17 2:56 PM

## 2017-12-04 NOTE — Progress Notes (Signed)
Please see note under "Initial prenatal visit"     Patient ID: Yvonne Herrera, female   DOB: 04-07-1996, 22 y.o.   MRN: [id number]  Reason for Consult: No chief complaint on file.   Referred by No ref. provider found  Subjective:     HPI:  Yvonne Herrera is a 22 y.o. female  She presents today for a new OB visit, but her urine pregnancy test was negative today in office. She reports that she had a positive pregnancy test at home about two weeks ago. Her last menstrual period was two months ago. She does desire pregnancy.  She has been taking a prenatal vitamin. She reports otherwise monthly menstrual periods. She recently stopped Depo Provera. She reports that her last dose of Depo was in October of 2019.       Past Medical History:  Diagnosis Date  . Dysmenorrhea   . Menorrhagia   . Obesity (BMI 30.0-34.9)         Family History  Problem Relation Age of Onset  . Colon cancer Maternal Grandmother   . Hypertension Maternal Grandfather   . Heart disease Maternal Grandfather   . Kidney failure Maternal Grandfather   . Hypertension Paternal Grandmother   . Hypertension Maternal Uncle   . Stroke Maternal Uncle   . Stroke Other   . Breast cancer Other        2 maternal great aunts with breast cancer 41(50s) and a maternal great aunt with colon cancer (65)  . Stomach cancer Other         Past Surgical History:  Procedure Laterality Date  . nexplanon  10/25/2012   RPH     Short Social History:  Social History       Tobacco Use  . Smoking status: Never Smoker  . Smokeless tobacco: Never Used  Substance Use Topics  . Alcohol use: No    No Known Allergies        Current Outpatient Medications  Medication Sig Dispense Refill  . cyclobenzaprine (FLEXERIL) 10 MG tablet Take 1 tablet (10 mg total) by mouth 3 (three) times daily as needed. 15 tablet 0  . naproxen sodium (ANAPROX) 550 MG tablet Take one BID prn menstrual cramping 30 tablet 5    . ondansetron (ZOFRAN ODT) 8 MG disintegrating tablet Take 1 tablet (8 mg total) every 8 (eight) hours as needed by mouth for nausea or vomiting. 30 tablet 1  . Prenatal Vit-Fe Fumarate-FA (PREPLUS) 27-1 MG TABS   12   No current facility-administered medications for this visit.     Review of Systems  Constitutional: Negative for chills, fatigue, fever and unexpected weight change.  HENT: Negative for trouble swallowing.  Eyes: Negative for loss of vision.  Respiratory: Negative for cough, shortness of breath and wheezing.  Cardiovascular: Negative for chest pain, leg swelling, palpitations and syncope.  GI: Negative for abdominal pain, blood in stool, diarrhea, nausea and vomiting.  GU: Negative for difficulty urinating, dysuria, frequency and hematuria.  Musculoskeletal: Negative for back pain, leg pain and joint pain.  Skin: Negative for rash.  Neurological: Negative for dizziness, headaches, light-headedness, numbness and seizures.  Psychiatric: Negative for behavioral problem, confusion, depressed mood and sleep disturbance.        Objective:  Objective   There were no vitals filed for this visit. There is no height or weight on file to calculate BMI.  Physical Exam  Constitutional: She is oriented to person, place, and time. She appears well-developed  and well-nourished.  HENT:  Head: Normocephalic and atraumatic.  Eyes: EOM are normal.  Cardiovascular: Normal rate, regular rhythm and normal heart sounds.  Pulmonary/Chest: Effort normal and breath sounds normal.  Neurological: She is alert and oriented to person, place, and time.  Skin: Skin is warm and dry.  Psychiatric: She has a normal mood and affect. Her behavior is normal. Judgment and thought content normal.  Nursing note and vitals reviewed.      Assessment/Plan:   22 yo G0P0000 with amenorrhea, positive home pregnancy test.  Will obtain a beta hcg.  She will follow up in 1 week.   Adelene Idler MD [facility], [facility] 12/04/17 2:56 PM

## 2017-12-05 LAB — BETA HCG QUANT (REF LAB): hCG Quant: <1 m[IU]/mL

## 2017-12-06 NOTE — Progress Notes (Signed)
Patient is not pregnant. Note sent to mychart.

## 2018-02-21 ENCOUNTER — Encounter: Payer: Self-pay | Admitting: Obstetrics and Gynecology

## 2018-02-21 ENCOUNTER — Ambulatory Visit (INDEPENDENT_AMBULATORY_CARE_PROVIDER_SITE_OTHER): Payer: Medicaid Other | Admitting: Obstetrics and Gynecology

## 2018-02-21 VITALS — BP 124/82 | HR 95 | Ht 63.0 in | Wt 204.0 lb

## 2018-02-21 DIAGNOSIS — N946 Dysmenorrhea, unspecified: Secondary | ICD-10-CM

## 2018-02-21 DIAGNOSIS — N911 Secondary amenorrhea: Secondary | ICD-10-CM

## 2018-02-21 MED ORDER — NAPROXEN SODIUM 550 MG PO TABS: ORAL_TABLET | ORAL | 5 refills | Status: DC

## 2018-02-27 ENCOUNTER — Other Ambulatory Visit: Payer: Self-pay | Admitting: Obstetrics and Gynecology

## 2018-02-27 ENCOUNTER — Telehealth: Payer: Self-pay | Admitting: Obstetrics and Gynecology

## 2018-02-27 ENCOUNTER — Other Ambulatory Visit: Payer: Medicaid Other

## 2018-02-27 NOTE — Telephone Encounter (Signed)
Patient is needing to get meds sent in for rash that she has. MD was to call into pharmacy but they haven't received it. Would like meds called to [facility] [place]. Pt cb# [phone]

## 2018-02-27 NOTE — Telephone Encounter (Signed)
Please advise. Thank you

## 2018-03-02 LAB — TSH+PRL+FSH+TESTT+LH+DHEA S...
17-Hydroxyprogesterone: 30 ng/dL
Androstenedione: 123 ng/dL (ref 41–262)
DHEA-SO4: 278.3 ug/dL (ref 110.0–431.7)
FSH: 5.4 m[IU]/mL
LH: 2.4 m[IU]/mL
Prolactin: 11.3 ng/mL (ref 4.8–23.3)
TESTOSTERONE FREE: 1.5 pg/mL (ref 0.0–4.2)
TSH: 1.09 u[IU]/mL (ref 0.450–4.500)
Testosterone: 42 ng/dL (ref 8–48)

## 2018-03-10 ENCOUNTER — Encounter: Payer: Medicaid Other | Admitting: Obstetrics and Gynecology

## 2018-03-10 ENCOUNTER — Other Ambulatory Visit: Payer: Medicaid Other

## 2018-03-13 ENCOUNTER — Ambulatory Visit (INDEPENDENT_AMBULATORY_CARE_PROVIDER_SITE_OTHER): Payer: Medicaid Other | Admitting: Obstetrics and Gynecology

## 2018-03-13 ENCOUNTER — Encounter: Payer: Self-pay | Admitting: Obstetrics and Gynecology

## 2018-03-13 ENCOUNTER — Ambulatory Visit (INDEPENDENT_AMBULATORY_CARE_PROVIDER_SITE_OTHER): Payer: Medicaid Other

## 2018-03-13 VITALS — BP 100/60 | HR 91 | Ht 63.0 in | Wt 206.0 lb

## 2018-03-13 DIAGNOSIS — N8301 Follicular cyst of right ovary: Secondary | ICD-10-CM

## 2018-03-13 DIAGNOSIS — N911 Secondary amenorrhea: Secondary | ICD-10-CM

## 2018-03-13 DIAGNOSIS — N8302 Follicular cyst of left ovary: Secondary | ICD-10-CM

## 2018-03-13 DIAGNOSIS — Z3169 Encounter for other general counseling and advice on procreation: Secondary | ICD-10-CM

## 2018-03-13 DIAGNOSIS — E282 Polycystic ovarian syndrome: Secondary | ICD-10-CM

## 2018-03-13 DIAGNOSIS — Q513 Bicornate uterus: Secondary | ICD-10-CM

## 2018-03-13 DIAGNOSIS — N760 Acute vaginitis: Secondary | ICD-10-CM

## 2018-03-13 MED ORDER — PRENATAL VITAMINS 0.8 MG PO TABS
1.0000 | ORAL_TABLET | Freq: Every day | ORAL | 12 refills | Status: DC
Start: 2018-03-13 — End: 2020-07-26

## 2018-03-13 MED ORDER — MEDROXYPROGESTERONE ACETATE 10 MG PO TABS: 10.0000 mg | ORAL_TABLET | Freq: Every day | ORAL | 2 refills | Status: DC

## 2018-03-13 MED ORDER — METFORMIN HCL 500 MG PO TABS: ORAL_TABLET | ORAL | 11 refills | Status: DC

## 2018-03-13 MED ORDER — FLUCONAZOLE 150 MG PO TABS: 150.0000 mg | ORAL_TABLET | ORAL | 0 refills | Status: AC

## 2018-03-13 NOTE — Progress Notes (Signed)
No periods since she has been off of depo for 5 months. She desires pregnancy. Will start metformin Desires ovulation induction  Adelene Idler MD [facility], [facility] 03/13/18 4:16 PM

## 2018-03-17 LAB — NUSWAB VAGINITIS PLUS (VG+)
CANDIDA GLABRATA, NAA: NEGATIVE
CHLAMYDIA TRACHOMATIS, NAA: NEGATIVE
Candida albicans, NAA: POSITIVE — AB
NEISSERIA GONORRHOEAE, NAA: NEGATIVE
Trich vag by NAA: NEGATIVE

## 2018-03-18 NOTE — Progress Notes (Signed)
Released to mychart, given diflucan rx in office.

## 2018-03-20 ENCOUNTER — Ambulatory Visit (INDEPENDENT_AMBULATORY_CARE_PROVIDER_SITE_OTHER): Payer: Medicaid Other | Admitting: Obstetrics and Gynecology

## 2018-03-20 ENCOUNTER — Encounter: Payer: Self-pay | Admitting: Obstetrics and Gynecology

## 2018-03-20 VITALS — BP 120/80 | HR 100 | Ht 63.0 in | Wt 206.0 lb

## 2018-03-20 DIAGNOSIS — E282 Polycystic ovarian syndrome: Secondary | ICD-10-CM

## 2018-03-20 DIAGNOSIS — N912 Amenorrhea, unspecified: Secondary | ICD-10-CM

## 2018-03-20 NOTE — Progress Notes (Signed)
   Patient ID: [name], female   DOB: 04-27-1996, 22 y.o.   MRN: [id number]  Reason for Consult: Follow-up (Still no period )   Referred by No ref. provider found  Subjective:     HPI:  Yvonne Herrera is a 22 y.o. female . She is following up to discuss ovulation induction.   Gynecological History  No LMP recorded. (Menstrual status: Irregular Periods).   History of fibroids, polyps, or ovarian cysts? : no  History of PCOS? no Hstory of Endometriosis? no History of abnormal pap smears? no Have you had any sexually transmitted infections in the past? no   Past Medical History:  Diagnosis Date  . Dysmenorrhea   . Menorrhagia   . Obesity (BMI 30.0-34.9)    Family History  Problem Relation Age of Onset  . Colon cancer Maternal Grandmother   . Hypertension Maternal Grandfather   . Heart disease Maternal Grandfather   . Kidney failure Maternal Grandfather   . Hypertension Paternal Grandmother   . Hypertension Maternal Uncle   . Stroke Maternal Uncle   . Stroke Other   . Breast cancer Other        2 maternal great aunts with breast cancer (74s) and a maternal great aunt with colon cancer (65)  . Stomach cancer Other    Past Surgical History:  Procedure Laterality Date  . nexplanon  10/25/2012   RPH     Short Social History:  Social History   Tobacco Use  . Smoking status: Never Smoker  . Smokeless tobacco: Never Used  Substance Use Topics  . Alcohol use: Yes    No Known Allergies  Current Outpatient Medications  Medication Sig Dispense Refill  . BLACK ELDERBERRY PO Take 1 each by mouth in the morning and at bedtime.    . Multiple Vitamins-Minerals (MULTIVITAMIN WITH MINERALS) tablet Take 1 tablet by mouth daily.     No current facility-administered medications for this visit.    REVIEW OF SYSTEMS      Objective:  Objective   Vitals:   03/20/18 1616  BP: 120/80  Pulse: 100  Weight: 206 lb (93.4 kg)  Height: 5\' 3"  (1.6 m)   Body mass  index is 36.49 kg/m.  Physical Exam  Assessment/Plan:     22 yo with irregular cycles, desires pregnancy Discussed ovulation induction instructions in detail with [place].   Adelene Idler MD [facility], [facility] 09/24/2020 5:52 PM

## 2018-03-25 ENCOUNTER — Ambulatory Visit: Admission: RE | Admit: 2018-03-25 | Payer: Medicaid Other | Source: Ambulatory Visit

## 2018-03-31 ENCOUNTER — Ambulatory Visit: Payer: Medicaid Other | Admitting: Obstetrics and Gynecology

## 2018-04-01 ENCOUNTER — Ambulatory Visit: Payer: Medicaid Other | Admitting: Obstetrics and Gynecology

## 2018-04-02 ENCOUNTER — Ambulatory Visit (INDEPENDENT_AMBULATORY_CARE_PROVIDER_SITE_OTHER): Payer: Medicaid Other | Admitting: Obstetrics and Gynecology

## 2018-04-02 ENCOUNTER — Encounter: Payer: Self-pay | Admitting: Obstetrics and Gynecology

## 2018-04-02 VITALS — BP 110/70 | HR 74 | Ht 64.0 in | Wt 208.0 lb

## 2018-04-02 DIAGNOSIS — N970 Female infertility associated with anovulation: Secondary | ICD-10-CM

## 2018-04-02 DIAGNOSIS — N839 Noninflammatory disorder of ovary, fallopian tube and broad ligament, unspecified: Secondary | ICD-10-CM

## 2018-04-02 DIAGNOSIS — E282 Polycystic ovarian syndrome: Secondary | ICD-10-CM

## 2018-04-02 MED ORDER — MEDROXYPROGESTERONE ACETATE 10 MG PO TABS: 10.0000 mg | ORAL_TABLET | Freq: Every day | ORAL | 0 refills | Status: DC

## 2018-04-02 MED ORDER — NAPROXEN 500 MG PO TABS: 500.0000 mg | ORAL_TABLET | Freq: Two times a day (BID) | ORAL | 2 refills | Status: DC

## 2018-04-02 MED ORDER — LETROZOLE 2.5 MG PO TABS: 2.5000 mg | ORAL_TABLET | Freq: Every day | ORAL | 0 refills | Status: AC

## 2018-04-02 NOTE — Progress Notes (Signed)
   Patient ID: Yvonne Herrera, female   DOB: Oct 17, 1995, 22 y.o.   MRN: [id number]  Reason for Consult: Follow-up   Referred by No ref. provider found  Subjective:     HPI:  Yvonne Herrera is a 22 y.o. female. She is following up regarding ovulation induction. She is ready to start this process.   Past Medical History:  Diagnosis Date  . Dysmenorrhea   . Menorrhagia   . Obesity (BMI 30.0-34.9)    Family History  Problem Relation Age of Onset  . Colon cancer Maternal Grandmother   . Hypertension Maternal Grandfather   . Heart disease Maternal Grandfather   . Kidney failure Maternal Grandfather   . Hypertension Paternal Grandmother   . Hypertension Maternal Uncle   . Stroke Maternal Uncle   . Stroke Other   . Breast cancer Other        2 maternal great aunts with breast cancer (50s) and a maternal great aunt with colon cancer (65)  . Stomach cancer Other    Past Surgical History:  Procedure Laterality Date  . nexplanon  10/25/2012   RPH     Short Social History:  Social History   Tobacco Use  . Smoking status: Never Smoker  . Smokeless tobacco: Never Used  Substance Use Topics  . Alcohol use: No    No Known Allergies  Current Outpatient Medications  Medication Sig Dispense Refill  . chlorhexidine (PERIDEX) 0.12 % solution TAKE 10 CC SWISH AND EXPECTORATE TWICE DAILY  0  . medroxyPROGESTERone (PROVERA) 10 MG tablet Take 1 tablet (10 mg total) by mouth daily. Use for ten days 10 tablet 2  . naproxen sodium (ANAPROX) 550 MG tablet Take one BID prn menstrual cramping 30 tablet 5  . Prenatal Multivit-Min-Fe-FA (PRENATAL VITAMINS) 0.8 MG tablet Take 1 tablet by mouth daily. 30 tablet 12  . letrozole (FEMARA) 2.5 MG tablet Take 1 tablet (2.5 mg total) by mouth daily for 5 days. 30 tablet 0  . medroxyPROGESTERone (PROVERA) 10 MG tablet Take 1 tablet (10 mg total) by mouth daily for 10 days. Start after first 14 days of premarin 60 tablet 0  . naproxen (NAPROSYN) 500  MG tablet Take 1 tablet (500 mg total) by mouth 2 (two) times daily with a meal. As needed for pain 60 tablet 2  . Prenatal Vit-Fe Fumarate-FA (PREPLUS) 27-1 MG TABS   12   No current facility-administered medications for this visit.     REVIEW OF SYSTEMS      Objective:  Objective   Vitals:   04/02/18 1559  BP: 110/70  Pulse: 74  Weight: 208 lb (94.3 kg)  Height: 5\' 4"  (1.626 m)   Body mass index is 35.7 kg/m.  Physical Exam      Assessment/Plan:     22 yo with PCOS, desires to start ovulation induction Prescriptions sent. Follow up to increase dose of letrozole to achieve ovulation if needed  Adelene Idler MD [facility], [facility] 04/02/18 4:27 PM

## 2018-04-03 ENCOUNTER — Ambulatory Visit: Payer: Medicaid Other

## 2018-04-04 ENCOUNTER — Telehealth: Payer: Self-pay | Admitting: Obstetrics and Gynecology

## 2018-04-04 NOTE — Telephone Encounter (Signed)
MRI department called to advise the pt has no show'd twice already for her appointment.

## 2018-04-07 NOTE — Telephone Encounter (Signed)
No, I didn't. When she was rescheduled, she said she could go to the appointment.

## 2018-04-07 NOTE — Telephone Encounter (Signed)
Did you know about this?   

## 2018-04-13 ENCOUNTER — Ambulatory Visit: Payer: Medicaid Other

## 2018-06-25 ENCOUNTER — Other Ambulatory Visit: Payer: Self-pay

## 2018-06-25 ENCOUNTER — Ambulatory Visit
Admission: EM | Admit: 2018-06-25 | Discharge: 2018-06-25 | Disposition: A | Discharge status: Home / Self Care | Payer: Medicaid Other | Attending: Family Medicine | Admitting: Family Medicine

## 2018-06-25 ENCOUNTER — Encounter: Payer: Self-pay | Admitting: Emergency Medicine

## 2018-06-25 DIAGNOSIS — R509 Fever, unspecified: Secondary | ICD-10-CM

## 2018-06-25 DIAGNOSIS — R51 Headache: Secondary | ICD-10-CM

## 2018-06-25 DIAGNOSIS — R69 Illness, unspecified: Secondary | ICD-10-CM

## 2018-06-25 DIAGNOSIS — J111 Influenza due to unidentified influenza virus with other respiratory manifestations: Secondary | ICD-10-CM

## 2018-06-25 LAB — RAPID INFLUENZA A&B ANTIGENS (ARMC ONLY): INFLUENZA A (ARMC): NEGATIVE

## 2018-06-25 LAB — RAPID INFLUENZA A&B ANTIGENS: Influenza B (ARMC): NEGATIVE

## 2018-06-25 LAB — RAPID STREP SCREEN (MED CTR MEBANE ONLY): STREPTOCOCCUS, GROUP A SCREEN (DIRECT): NEGATIVE

## 2018-06-25 LAB — PREGNANCY, URINE: Preg Test, Ur: NEGATIVE

## 2018-06-25 MED ORDER — OSELTAMIVIR PHOSPHATE 75 MG PO CAPS: 75.0000 mg | ORAL_CAPSULE | Freq: Two times a day (BID) | ORAL | 0 refills | Status: DC

## 2018-06-25 NOTE — ED Triage Notes (Signed)
Patient c/o sore throat, runny nose, HAs, bilateral ear pain, and chills that started on Monday.

## 2018-06-25 NOTE — Discharge Instructions (Signed)
Rest. Fluids.  Medication as prescribed.   Take care  Dr. Ventura Hollenbeck  

## 2018-06-25 NOTE — ED Provider Notes (Signed)
[facility]    CSN: [id number] Arrival date & time: 06/25/18  1234  History   Chief Complaint Chief Complaint  Patient presents with  . Sore Throat  . Generalized Body Aches  . Emesis   HPI   23 year old female presents with flu symptoms.  Patient reports that her symptoms started on Monday.  She reports sore throat, ear pain, body aches, fever, chills, headaches.  She had a fever last night of 103.  She has taken NyQuil without resolution.  No other medications or interventions tried.  Symptoms are severe.  No known exacerbating factors.  No other associated symptoms.  No other complaints.  PMH, Surgical Hx, Family Hx, Social History reviewed and updated as below.  Past Medical History:  Diagnosis Date  . Dysmenorrhea   . Menorrhagia   . Obesity (BMI 30.0-34.9)     Patient Active Problem List   Diagnosis Date Noted  . History of dysmenorrhea 04/18/2017  . Menometrorrhagia 08/02/2016    Past Surgical History:  Procedure Laterality Date  . nexplanon  10/25/2012   RPH     OB History    Gravida  0   Para  0   Term  0   Preterm  0   AB  0   Living  0     SAB  0   TAB  0   Ectopic  0   Multiple  0   Live Births  0            Home Medications    Prior to Admission medications   Medication Sig Start Date End Date Taking? Authorizing Provider  Prenatal Multivit-Min-Fe-FA (PRENATAL VITAMINS) 0.8 MG tablet Take 1 tablet by mouth daily. 03/13/18  Yes Schuman, Christanna R, MD  chlorhexidine (PERIDEX) 0.12 % solution TAKE 10 CC SWISH AND EXPECTORATE TWICE DAILY 03/14/18   [provider]  naproxen (NAPROSYN) 500 MG tablet Take 1 tablet (500 mg total) by mouth 2 (two) times daily with a meal. As needed for pain 04/02/18   Schuman, Christanna R, MD  naproxen sodium (ANAPROX) 550 MG tablet Take one BID prn menstrual cramping 02/21/18   Schuman, Christanna R, MD  oseltamivir (TAMIFLU) 75 MG capsule Take 1 capsule (75 mg total) by  mouth every 12 (twelve) hours. 06/25/18   Tommie Sams, DO    Family History Family History  Problem Relation Age of Onset  . Colon cancer Maternal Grandmother   . Hypertension Maternal Grandfather   . Heart disease Maternal Grandfather   . Kidney failure Maternal Grandfather   . Hypertension Paternal Grandmother   . Hypertension Maternal Uncle   . Stroke Maternal Uncle   . Stroke Other   . Breast cancer Other        2 maternal great aunts with breast cancer (16s) and a maternal great aunt with colon cancer (65)  . Stomach cancer Other     Social History Social History   Tobacco Use  . Smoking status: Never Smoker  . Smokeless tobacco: Never Used  Substance Use Topics  . Alcohol use: Yes  . Drug use: No     Allergies   Patient has no known allergies.   Review of Systems Review of Systems Per HPI  Physical Exam Triage Vital Signs ED Triage Vitals  Enc Vitals Group     BP 06/25/18 1250 120/71     Pulse Rate 06/25/18 1250 100     Resp 06/25/18 1250 16  Temp 06/25/18 1250 98.4 F (36.9 C)     Temp Source 06/25/18 1250 Oral     SpO2 06/25/18 1250 100 %     Weight 06/25/18 1246 200 lb (90.7 kg)     Height 06/25/18 1246 5\' 4"  (1.626 m)     Head Circumference --      Peak Flow --      Pain Score 06/25/18 1245 8     Pain Loc --      Pain Edu? --      Excl. in GC? --    Updated Vital Signs BP 120/71 (BP Location: Left Arm)   Pulse 100   Temp 98.4 F (36.9 C) (Oral)   Resp 16   Ht 5\' 4"  (1.626 m)   Wt 90.7 kg   LMP 06/22/2018 (Exact Date)   SpO2 100%   BMI 34.33 kg/m   Visual Acuity Right Eye Distance:   Left Eye Distance:   Bilateral Distance:    Right Eye Near:   Left Eye Near:    Bilateral Near:     Physical Exam Vitals signs and nursing note reviewed.  Constitutional:      General: She is not in acute distress. HENT:     Head: Normocephalic and atraumatic.     Nose: Nose normal.     Mouth/Throat:     Pharynx: Oropharynx is clear.  Posterior oropharyngeal erythema present.  Eyes:     General:        Right eye: No discharge.        Left eye: No discharge.     Conjunctiva/sclera: Conjunctivae normal.  Cardiovascular:     Rate and Rhythm: Normal rate and regular rhythm.  Pulmonary:     Effort: Pulmonary effort is normal.     Breath sounds: No wheezing, rhonchi or rales.  Neurological:     Mental Status: She is alert.  Psychiatric:        Mood and Affect: Mood normal.        Behavior: Behavior normal.    UC Treatments / Results  Labs (all labs ordered are listed, but only abnormal results are displayed) Labs Reviewed  RAPID STREP SCREEN ([facility] ONLY)  RAPID INFLUENZA A&B ANTIGENS ([facility] ONLY)  CULTURE, GROUP A STREP [facility])  PREGNANCY, URINE    EKG None  Radiology No results found.  Procedures Procedures (including critical care time)  Medications Ordered in UC Medications - No data to display  Initial Impression / Assessment and Plan / UC Course  I have reviewed the triage vital signs and the nursing notes.  Pertinent labs & imaging results that were available during my care of the patient were reviewed by me and considered in my medical decision making (see chart for details).    23 year old female presents with influenza-like illness.  Flu testing was negative today.  However, given clinical picture, fever last night and the current status of flu in our community I am placing her on Tamiflu.  Work note given.  School note given.  Final Clinical Impressions(s) / UC Diagnoses   Final diagnoses:  Influenza-like illness     Discharge Instructions     Rest.  Fluids.  Medication as prescribed.  Take care  Dr. Adriana Simasook    ED Prescriptions    Medication Sig Dispense Auth. Provider   oseltamivir (TAMIFLU) 75 MG capsule Take 1 capsule (75 mg total) by mouth every 12 (twelve) hours. 10 capsule Tommie Samsook, Desha Bitner G, DO  Controlled Substance Prescriptions [place] Controlled Substance  Registry consulted? Not Applicable   Tommie SamsCook, Katlynne Mckercher G, DO 06/25/18 1428

## 2018-06-27 ENCOUNTER — Ambulatory Visit: Payer: Medicaid Other | Admitting: Obstetrics and Gynecology

## 2018-06-27 LAB — CULTURE, GROUP A STREP (THRC)

## 2018-07-03 ENCOUNTER — Ambulatory Visit: Payer: Medicaid Other | Admitting: Obstetrics and Gynecology

## 2018-07-04 ENCOUNTER — Ambulatory Visit (INDEPENDENT_AMBULATORY_CARE_PROVIDER_SITE_OTHER): Payer: Self-pay | Admitting: Obstetrics and Gynecology

## 2018-07-04 ENCOUNTER — Encounter: Payer: Self-pay | Admitting: Obstetrics and Gynecology

## 2018-07-04 VITALS — BP 118/70 | Ht 64.0 in | Wt 204.0 lb

## 2018-07-04 DIAGNOSIS — N839 Noninflammatory disorder of ovary, fallopian tube and broad ligament, unspecified: Secondary | ICD-10-CM

## 2018-07-04 DIAGNOSIS — E282 Polycystic ovarian syndrome: Secondary | ICD-10-CM

## 2018-07-04 DIAGNOSIS — Z3169 Encounter for other general counseling and advice on procreation: Secondary | ICD-10-CM

## 2018-07-04 DIAGNOSIS — N970 Female infertility associated with anovulation: Secondary | ICD-10-CM

## 2018-07-04 NOTE — Progress Notes (Signed)
Patient ID: CHRYSTINA PARGA, female   DOB: 09/04/1995, 23 y.o.   MRN: [id number]  Reason for Consult: Follow-up (Cramping in LQ and back pain )   Referred by No ref. provider found  Subjective:     HPI:  SHADI WHITCOMB is a 23 y.o. female. She presents today for follow up. She would like to become pregnant. She had previously been prescribed letrozole, metformin and progesterone. Because of her limited insurance she has not had regular follow up.  She has brought all of her medication with her today.  She reports that she does not know what medicine does what and she has not been taking the letrozole or metformin.  She reports that she did take 10 days of progesterone and was able to have a period.  The first day of her last menstrual period was the 19th of January.  She reports that she no longer has the instructions that I printed off for her before about the medications.  Past Medical History:  Diagnosis Date  . Dysmenorrhea   . Menorrhagia   . Obesity (BMI 30.0-34.9)    Family History  Problem Relation Age of Onset  . Colon cancer Maternal Grandmother   . Hypertension Maternal Grandfather   . Heart disease Maternal Grandfather   . Kidney failure Maternal Grandfather   . Hypertension Paternal Grandmother   . Hypertension Maternal Uncle   . Stroke Maternal Uncle   . Stroke Other   . Breast cancer Other        2 maternal great aunts with breast cancer (28s) and a maternal great aunt with colon cancer (65)  . Stomach cancer Other    Past Surgical History:  Procedure Laterality Date  . nexplanon  10/25/2012   RPH     Short Social History:  Social History   Tobacco Use  . Smoking status: Never Smoker  . Smokeless tobacco: Never Used  Substance Use Topics  . Alcohol use: Yes    No Known Allergies  Current Outpatient Medications  Medication Sig Dispense Refill  . chlorhexidine (PERIDEX) 0.12 % solution TAKE 10 CC SWISH AND EXPECTORATE TWICE DAILY  0  . naproxen  (NAPROSYN) 500 MG tablet Take 1 tablet (500 mg total) by mouth 2 (two) times daily with a meal. As needed for pain (Patient not taking: Reported on 07/04/2018) 60 tablet 2  . naproxen sodium (ANAPROX) 550 MG tablet Take one BID prn menstrual cramping (Patient not taking: Reported on 07/04/2018) 30 tablet 5  . Prenatal Multivit-Min-Fe-FA (PRENATAL VITAMINS) 0.8 MG tablet Take 1 tablet by mouth daily. (Patient not taking: Reported on 07/04/2018) 30 tablet 12   No current facility-administered medications for this visit.     Review of Systems  Constitutional: Negative for chills, fatigue, fever and unexpected weight change.  HENT: Negative for trouble swallowing.  Eyes: Negative for loss of vision.  Respiratory: Negative for cough, shortness of breath and wheezing.  Cardiovascular: Negative for chest pain, leg swelling, palpitations and syncope.  GI: Negative for abdominal pain, blood in stool, diarrhea, nausea and vomiting.  GU: Negative for difficulty urinating, dysuria, frequency and hematuria.  Musculoskeletal: Negative for back pain, leg pain and joint pain.  Skin: Negative for rash.  Neurological: Negative for dizziness, headaches, light-headedness, numbness and seizures.  Psychiatric: Negative for behavioral problem, confusion, depressed mood and sleep disturbance.        Objective:  Objective   Vitals:   07/04/18 1110  BP: 118/70  Weight: 204 lb (  92.5 kg)  Height: 5\' 4"  (1.626 m)   Body mass index is 35.02 kg/m.  Physical Exam Vitals signs and nursing note reviewed.  Constitutional:      Appearance: She is well-developed.  HENT:     Head: Normocephalic and atraumatic.  Eyes:     Pupils: Pupils are equal, round, and reactive to light.  Cardiovascular:     Rate and Rhythm: Normal rate and regular rhythm.  Pulmonary:     Effort: Pulmonary effort is normal. No respiratory distress.  Skin:    General: Skin is warm and dry.  Neurological:     Mental Status: She is alert  and oriented to person, place, and time.  Psychiatric:        Behavior: Behavior normal.        Thought Content: Thought content normal.        Judgment: Judgment normal.        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  I highlighted important days for the patient.  I encouraged her to purchase a calendar so that she can write the number of days and track when she is supposed to do things in relation to her period.  I reviewed the importance of taking LH test at home to help determine ovulation. I discussed regular intercourse every 2 days when she is close to ovulation. She will follow-up in 1 month.   More than 40 minutes were spent face to face with the patient in the room with more than 50% of the time spent providing counseling and discussing the plan of management.      Adelene Idlerhristanna Elianne Gubser MD [facility], [facility] 07/04/2018 6:30 PM

## 2018-08-08 ENCOUNTER — Ambulatory Visit: Payer: Medicaid Other | Admitting: Obstetrics and Gynecology

## 2018-08-11 ENCOUNTER — Encounter: Payer: Self-pay | Admitting: Obstetrics and Gynecology

## 2018-08-11 ENCOUNTER — Ambulatory Visit (INDEPENDENT_AMBULATORY_CARE_PROVIDER_SITE_OTHER): Payer: Medicaid Other | Admitting: Obstetrics and Gynecology

## 2018-08-11 VITALS — BP 108/62 | HR 92 | Ht 64.0 in | Wt 203.0 lb

## 2018-08-11 DIAGNOSIS — N970 Female infertility associated with anovulation: Secondary | ICD-10-CM

## 2018-08-11 DIAGNOSIS — N912 Amenorrhea, unspecified: Secondary | ICD-10-CM

## 2018-08-11 DIAGNOSIS — E282 Polycystic ovarian syndrome: Secondary | ICD-10-CM

## 2018-08-11 DIAGNOSIS — N839 Noninflammatory disorder of ovary, fallopian tube and broad ligament, unspecified: Secondary | ICD-10-CM

## 2018-08-11 NOTE — Progress Notes (Signed)
Patient ID: Yvonne Herrera, female   DOB: 1996-05-14, 23 y.o.   MRN: [id number]  Reason for Consult: Follow-up   Referred by No ref. provider found  Subjective:     HPI:  Yvonne Herrera is a 23 y.o. female she is following with me for infertility. She is happy to report that she had a normal cycle in January and February. She has been taking the Letrozole 2.5mg . We reviewed the instructions for ovulation induction using Letrozole. She has been taking metformin. She reports that she also has the urine LH tests, when she has done them they have not shown a happy face.    Past Medical History:  Diagnosis Date  . Dysmenorrhea   . Menorrhagia   . Obesity (BMI 30.0-34.9)    Family History  Problem Relation Age of Onset  . Colon cancer Maternal Grandmother   . Hypertension Maternal Grandfather   . Heart disease Maternal Grandfather   . Kidney failure Maternal Grandfather   . Hypertension Paternal Grandmother   . Hypertension Maternal Uncle   . Stroke Maternal Uncle   . Stroke Other   . Breast cancer Other        2 maternal great aunts with breast cancer (49s) and a maternal great aunt with colon cancer (65)  . Stomach cancer Other    Past Surgical History:  Procedure Laterality Date  . nexplanon  10/25/2012   RPH     Short Social History:  Social History   Tobacco Use  . Smoking status: Never Smoker  . Smokeless tobacco: Never Used  Substance Use Topics  . Alcohol use: Yes    No Known Allergies  Current Outpatient Medications  Medication Sig Dispense Refill  . letrozole (FEMARA) 2.5 MG tablet Take 2.5 mg by mouth daily.    . metFORMIN (GLUCOPHAGE) 500 MG tablet Take by mouth 2 (two) times daily with a meal.    . naproxen sodium (ANAPROX) 550 MG tablet Take one BID prn menstrual cramping 30 tablet 5  . Prenatal Multivit-Min-Fe-FA (PRENATAL VITAMINS) 0.8 MG tablet Take 1 tablet by mouth daily. 30 tablet 12  . progesterone (PROMETRIUM) 100 MG capsule Take 100 mg by  mouth daily.    . chlorhexidine (PERIDEX) 0.12 % solution TAKE 10 CC SWISH AND EXPECTORATE TWICE DAILY  0  . naproxen (NAPROSYN) 500 MG tablet Take 1 tablet (500 mg total) by mouth 2 (two) times daily with a meal. As needed for pain (Patient not taking: Reported on 07/04/2018) 60 tablet 2   No current facility-administered medications for this visit.     Review of Systems  Constitutional: Negative for chills, fatigue, fever and unexpected weight change.  HENT: Negative for trouble swallowing.  Eyes: Negative for loss of vision.  Respiratory: Negative for cough, shortness of breath and wheezing.  Cardiovascular: Negative for chest pain, leg swelling, palpitations and syncope.  GI: Negative for abdominal pain, blood in stool, diarrhea, nausea and vomiting.  GU: Negative for difficulty urinating, dysuria, frequency and hematuria.  Musculoskeletal: Negative for back pain, leg pain and joint pain.  Skin: Negative for rash.  Neurological: Negative for dizziness, headaches, light-headedness, numbness and seizures.  Psychiatric: Negative for behavioral problem, confusion, depressed mood and sleep disturbance.        Objective:  Objective   Vitals:   08/11/18 1443  BP: 108/62  Pulse: 92  Weight: 203 lb (92.1 kg)  Height: 5\' 4"  (1.626 m)   Body mass index is 34.84 kg/m.  Physical  Exam Vitals signs and nursing note reviewed.  Constitutional:      Appearance: She is well-developed.  HENT:     Head: Normocephalic and atraumatic.  Eyes:     Pupils: Pupils are equal, round, and reactive to light.  Cardiovascular:     Rate and Rhythm: Normal rate and regular rhythm.  Pulmonary:     Effort: Pulmonary effort is normal. No respiratory distress.  Abdominal:     General: Abdomen is flat.     Palpations: Abdomen is soft.  Skin:    General: Skin is warm and dry.  Neurological:     Mental Status: She is alert and oriented to person, place, and time.  Psychiatric:        Behavior:  Behavior normal.        Thought Content: Thought content normal.        Judgment: Judgment normal.        Assessment/Plan:     Reviewed Instructions for ovulation Induction using Letrozole. Patient says that she still has the information that she was provided with before and is becoming more comfortable with the routine. She has been taking 2.5mg  of Letrozole during cycle days 5-10. She has the [facility] tests, but she has not taken them daily during the expected ovulation days. So far she has not had a positive LH test. She will increase letrozole to 5mg . She will take LH tests between days 9-21 this month and report if she has a positive one.  She knows that if her period does not come by cycle day 35 that she should take a urine pregnancy test. If the test is not positive then she will take 10 days of progesterone to bring on a period.   Discussed possible septate uterus. She did not get pelvic MRI in the fall. She is interested in this again, sent note to [place] to schedule.    She will follow up in 3 months.    More than 25 minutes were spent face to face with the patient in the room with more than 50% of the time spent providing counseling and discussing the plan of management.   Adelene Idler MD [facility], [facility] 08/11/2018 3:31 PM

## 2018-08-14 ENCOUNTER — Telehealth: Payer: Self-pay | Admitting: Obstetrics and Gynecology

## 2018-08-14 NOTE — Telephone Encounter (Signed)
Contacted patient to let her know she now has Medicaid FPW that will not pay for the MRI, and that she could either contact Medicaid to see if she qualifies for another form of Medicaid, or she could contact [facility] Patient Financial Services for payment options and then call back to schedule. Patient said she understood and wasn't interested in calling for payment options.

## 2018-08-18 NOTE — Telephone Encounter (Signed)
Thank you. aware 

## 2018-09-09 ENCOUNTER — Telehealth: Payer: Self-pay

## 2018-09-09 NOTE — Telephone Encounter (Signed)
Pt has naproxyn for cramps but it's really not working.  Can something else be rx'd?  [phone]

## 2018-09-09 NOTE — Telephone Encounter (Signed)
Please advise. Thank you

## 2018-09-11 NOTE — Telephone Encounter (Signed)
I can not prescribe anything else for cramps

## 2018-09-15 ENCOUNTER — Telehealth: Payer: Self-pay | Admitting: Obstetrics and Gynecology

## 2018-09-15 DIAGNOSIS — N920 Excessive and frequent menstruation with regular cycle: Secondary | ICD-10-CM

## 2018-09-15 DIAGNOSIS — N946 Dysmenorrhea, unspecified: Secondary | ICD-10-CM

## 2018-09-15 MED ORDER — ACETAMINOPHEN-CODEINE #3 300-30 MG PO TABS: 1.0000 | ORAL_TABLET | Freq: Four times a day (QID) | ORAL | 0 refills | Status: AC | PRN

## 2018-09-15 MED ORDER — TRANEXAMIC ACID 650 MG PO TABS: 1300.0000 mg | ORAL_TABLET | Freq: Three times a day (TID) | ORAL | 0 refills | Status: AC

## 2018-09-15 NOTE — Telephone Encounter (Signed)
Patient's mother called explain patient has been reporting a lot of vaginal bleeding. DPR on file didn't have her mother's name listed to speak with. Can you please contact patient. She was just seen on 08/11/18 with Dr. Jerene Pitch for follow up visit

## 2018-09-15 NOTE — Telephone Encounter (Signed)
Pt states "when stands feels like she is peeing on herself she is bleeding so much", normal size clots, pain is moderate, pt states "cramps feel like contractions for about 5 mins long". Started Sunday morning normal period, but started to get heavier through sunday and this morning. Pt got agitated when told her that schuman cannot give anything stronger for cramps, Pt started yelling "what do you mean she cant give me anything stronger" I advised pt I am only trying to get information to give to the doctor and going off of the notes she has written before. Pt calmed down after speaking with her for a little while she will go to the ER is she starts to bleed any heavier or feels that she needs to go there.

## 2018-09-15 NOTE — Telephone Encounter (Signed)
Pt states she has not heard anything back from her call last week. She is having heavy bleeding and cramping.

## 2018-09-15 NOTE — Telephone Encounter (Signed)
Called and returned patient's phone call. Reports that she has been having painful cramps since last week. Says that her period was supposed to start Friday according to the application on her phone but that it didn't start until Sunday. She reported to my medical assistant that she has had heavy bleeding that felt like she was peeing on herself and that she has been passing clots. I tried to inquire about the bleeding amount but she told me that she doesn't know how many pads she typically fills an hour because she changes her pad every time she goes to the bathroom. Dalecia then became very angry and yelled at me on the phone. She accused me of not addressing her problems with painful periods and heavy bleeding and that all anyone ever did was try and put her on birth control. She said that she spoke to [facility] doctors and they told her she should find another physician who would address her concerns. I told Carling that at her visit one month ago she had not reported to me painful cramps or heavy bleeding. I also discussed with her that she has been trying to conceive and I have been helping her with taking fertility medications.  She then agreed and stated she wanted to know what I was going to do "right now, today" for her painful periods. She then said that it hadn't been me specifically who had not addressed her heavy bleeding in pain but other doctors in the past.  Patient was very upset on the phone and I had difficulty even speaking because Vance was yelling so much and interrupting me. I asked her to calm her voice and let me give her some suggestions for dealing with painful periods.  I discussed using a heating pad, taking naproxen regularly twice a day, warm showers and baths, and then offered to send a prescriptions for both Tylenol #3 (for pain) and Lysteda (for heavy bleeding). Jamell then continued to yell and accused me of not listening to her because she had told a nurse that she had already tried a  heating pad and a shower. She then said, " This is not yelling, THIS IS YELLING!" and started screaming into the phone. I tried to end the conversation in an agreeable fashion. She then accused me of not responding to her concerns quickly enough. I apologized and said that I was happy to address her concerns today and that I would send the medications. She asked if these medications would interfere with her future chances of getting pregnant and I told her they would not. She then requested to change her pharmacy. I did change her pharmacy in the computer. I asked her to give me a second while I did this in the computer. I was becoming upset by the tone in which the patient was speaking to me and my voice had changed to being shaky.This was noticeable to [place] who laughed and said. " Oh my god, its not like I'm there with my hands around your throat. " "Its not that serious."  I said that I had sent the prescriptions and that she could let me know if she did not have improvement of her symptoms and she said, "Trust me you won't be hearing from me again."  I also told her that if she is filling more than a pad an hour for 2 hours or feeling faint, dizzy, lightheaded that she should go to the hospital for IV medication to help stop the bleeding.  Phone call ended.  Patient's mother then called the office asking to speak to me, I was not comfortable doing this and asked my office manager to return the phone call for me.   Given the conversation that transpired I am no longer comfortable providing care for the patient and think ending our doctor patient care situation would be wise especially since she does not think I am addressing her concerns.  Will consult with office manager and risk management regarding this situation.   Adelene Idlerhristanna Shunsuke Granzow MD [facility], [facility] 09/15/2018 12:52 PM

## 2018-09-16 ENCOUNTER — Telehealth: Payer: Self-pay | Admitting: Obstetrics and Gynecology

## 2018-09-16 NOTE — Telephone Encounter (Signed)
Maxine Glenn Leftwich spoke to mother regarding Yvonne Herrera in regards to complaint on Dr Jerene Pitch

## 2018-09-16 NOTE — Telephone Encounter (Signed)
Spoke with Yvonne Herrera, mother of Yvonne Herrera, she is on the approved list to speak with regarding her care.  Mother was concerned about conversation her daughter had with Dr Jerene Pitch on 09-15-18, said her daughter has used to boxes of tampons in two days.  Says she feels like she has to pee and nothing but blood comes out.  I advised the mother that if [name] was bleeding this heavily, she needed to go to Urgent Care of ED as the provider she wanted to see Farrel Conners was not in clinic until this Thursday.  Mother said that [name] was going to [facility] today and if she needed appointment she would have [place] notify me.

## 2018-11-03 ENCOUNTER — Encounter: Payer: Self-pay | Admitting: Obstetrics & Gynecology

## 2018-11-03 ENCOUNTER — Other Ambulatory Visit (HOSPITAL_COMMUNITY)
Admission: RE | Admit: 2018-11-03 | Discharge: 2018-11-03 | Disposition: A | Discharge status: Home / Self Care | Payer: Medicaid Other | Source: Ambulatory Visit | Attending: Obstetrics & Gynecology | Admitting: Obstetrics & Gynecology

## 2018-11-03 ENCOUNTER — Other Ambulatory Visit: Payer: Self-pay

## 2018-11-03 ENCOUNTER — Telehealth: Payer: Self-pay

## 2018-11-03 ENCOUNTER — Ambulatory Visit (INDEPENDENT_AMBULATORY_CARE_PROVIDER_SITE_OTHER): Payer: Medicaid Other | Admitting: Obstetrics & Gynecology

## 2018-11-03 VITALS — BP 120/80 | Ht 63.0 in | Wt 197.0 lb

## 2018-11-03 DIAGNOSIS — N920 Excessive and frequent menstruation with regular cycle: Secondary | ICD-10-CM

## 2018-11-03 DIAGNOSIS — Z124 Encounter for screening for malignant neoplasm of cervix: Secondary | ICD-10-CM | POA: Insufficient documentation

## 2018-11-03 DIAGNOSIS — N3001 Acute cystitis with hematuria: Secondary | ICD-10-CM

## 2018-11-03 DIAGNOSIS — N946 Dysmenorrhea, unspecified: Secondary | ICD-10-CM

## 2018-11-03 LAB — POCT URINALYSIS DIPSTICK
Bilirubin, UA: NEGATIVE
Blood, UA: POSITIVE
Glucose, UA: NEGATIVE
Ketones, UA: NEGATIVE
Nitrite, UA: NEGATIVE
Protein, UA: NEGATIVE
Spec Grav, UA: 1.01 (ref 1.010–1.025)
Urobilinogen, UA: 0.2 E.U./dL
pH, UA: 5 (ref 5.0–8.0)

## 2018-11-03 MED ORDER — TRANEXAMIC ACID 650 MG PO TABS: 1300.0000 mg | ORAL_TABLET | Freq: Three times a day (TID) | ORAL | 2 refills | Status: DC

## 2018-11-03 MED ORDER — SULFAMETHOXAZOLE-TRIMETHOPRIM 800-160 MG PO TABS: 1.0000 | ORAL_TABLET | Freq: Two times a day (BID) | ORAL | 0 refills | Status: AC

## 2018-11-03 NOTE — Progress Notes (Signed)
HPI:      Yvonne Herrera is a 23 y.o. G0P0000 who LMP was Patient's last menstrual period was 10/10/2018., presents today for a problem visit.  She complains of menorrhagia that  began several months ago and its severity is described as severe.  She has regular periods every 28 days and they are associated with severe menstrual cramping.  She has used the following for attempts at control: maxi pad and tampon.  Previous evaluation: none. Prior Diagnosis: dysfunctional uterine bleeding. Previous Treatment: Naprosyn, Prometrium. Has been on Depo and Nexplanon in past.  Last Depo 01/2018 Desires pregnancy in near future.  No hormones desired.  She is single partner, contraception - none.  Hx of STDs: none. She is premenopausal.  PMHx: She  has a past medical history of Dysmenorrhea, Menorrhagia, and Obesity (BMI 30.0-34.9). Also,  has a past surgical history that includes nexplanon (10/25/2012)., family history includes Breast cancer in an other family member; Colon cancer in her maternal grandmother; Heart disease in her maternal grandfather; Hypertension in her maternal grandfather, maternal uncle, and paternal grandmother; Kidney failure in her maternal grandfather; Stomach cancer in an other family member; Stroke in her maternal uncle and another family member.,  reports that she has never smoked. She has never used smokeless tobacco. She reports current alcohol use. She reports that she does not use drugs.  She  Current Outpatient Medications:  .  chlorhexidine (PERIDEX) 0.12 % solution, TAKE 10 CC SWISH AND EXPECTORATE TWICE DAILY, Disp: , Rfl: 0 .  metFORMIN (GLUCOPHAGE) 500 MG tablet, Take by mouth 2 (two) times daily with a meal., Disp: , Rfl:  .  Prenatal Multivit-Min-Fe-FA (PRENATAL VITAMINS) 0.8 MG tablet, Take 1 tablet by mouth daily. (Patient not taking: Reported on 11/03/2018), Disp: 30 tablet, Rfl: 12 .  sulfamethoxazole-trimethoprim (BACTRIM DS) 800-160 MG tablet, Take 1 tablet  by mouth 2 (two) times daily for 3 days., Disp: 6 tablet, Rfl: 0 .  tranexamic acid (LYSTEDA) 650 MG TABS tablet, Take 2 tablets (1,300 mg total) by mouth 3 (three) times daily. Take during menses for a maximum of five days, Disp: 30 tablet, Rfl: 2  Also, has No Known Allergies.  Review of Systems  Constitutional: Negative for chills, fever and malaise/fatigue.  HENT: Negative for congestion, sinus pain and sore throat.   Eyes: Negative for blurred vision and pain.  Respiratory: Negative for cough and wheezing.   Cardiovascular: Negative for chest pain and leg swelling.  Gastrointestinal: Negative for abdominal pain, constipation, diarrhea, heartburn, nausea and vomiting.  Genitourinary: Negative for dysuria, frequency, hematuria and urgency.  Musculoskeletal: Negative for back pain, joint pain, myalgias and neck pain.  Skin: Negative for itching and rash.  Neurological: Negative for dizziness, tremors and weakness.  Endo/Heme/Allergies: Does not bruise/bleed easily.  Psychiatric/Behavioral: Negative for depression. The patient is not nervous/anxious and does not have insomnia.     Objective: BP 120/80   Ht 5\' 3"  (1.6 m)   Wt 197 lb (89.4 kg)   LMP 10/10/2018   BMI 34.90 kg/m  Physical Exam Constitutional:      General: She is not in acute distress.    Appearance: She is well-developed.  Genitourinary:     Pelvic exam was performed with patient supine.     Vagina and uterus normal.     No vaginal erythema or bleeding.     No cervical motion tenderness, discharge, polyp or nabothian cyst.     Uterus is mobile.     Uterus  is not enlarged.     No uterine mass detected.    Uterus is midaxial.     No right or left adnexal mass present.     Right adnexa not tender.     Left adnexa not tender.  HENT:     Head: Normocephalic and atraumatic.     Nose: Nose normal.  Abdominal:     General: There is no distension.     Palpations: Abdomen is soft.     Tenderness: There is no  abdominal tenderness.  Musculoskeletal: Normal range of motion.  Neurological:     Mental Status: She is alert and oriented to person, place, and time.     Cranial Nerves: No cranial nerve deficit.  Skin:    General: Skin is warm and dry.    Results for orders placed or performed in visit on 11/03/18  POCT urinalysis dipstick  Result Value Ref Range   Color, UA     Clarity, UA     Glucose, UA Negative Negative   Bilirubin, UA neg    Ketones, UA neg    Spec Grav, UA 1.010 1.010 - 1.025   Blood, UA positive    pH, UA 5.0 5.0 - 8.0   Protein, UA Negative Negative   Urobilinogen, UA 0.2 0.2 or 1.0 E.U./dL   Nitrite, UA neg    Leukocytes, UA Small (1+) (A) Negative   Appearance     Odor       ASSESSMENT/PLAN:  menorrhagia and dysmenorrhea (new problem) Also, UTI  Problem List Items Addressed This Visit      Genitourinary   Dysmenorrhea - Primary   Relevant Orders   POCT urinalysis dipstick (Completed)     Other   Menorrhagia with regular cycle   Relevant Medications   tranexamic acid (LYSTEDA) 650 MG TABS tablet    Other Visit Diagnoses    Screening for cervical cancer       Relevant Orders   Cytology - PAP   Acute cystitis with hematuria       Relevant Medications   sulfamethoxazole-trimethoprim (BACTRIM DS) 800-160 MG tablet    Lysteda pros and cons discussed, eRx F/u - a few months Discontinue Prometrium and Femara and Naprosyn Cont PNV OK to stop Metformin (pros and cons discussed)    No evidence for PCOS, low liklihood of being of help Alternatives discussed- OCP, Depo, IUD  Annamarie MajorPaul Desmen Schoffstall, MD, Merlinda FrederickFACOG [facility], [facility] 11/03/2018  10:08 AM

## 2018-11-03 NOTE — Patient Instructions (Signed)
Tranexamic acid oral tablets  What is this medicine?  TRANEXAMIC ACID (TRAN ex AM ik AS id) slows down or stops blood clots from being broken down. This medicine is used to treat heavy monthly menstrual bleeding.  This medicine may be used for other purposes; ask your health care provider or pharmacist if you have questions.  COMMON BRAND NAME(S): Cyklokapron, Lysteda  What should I tell my health care provider before I take this medicine?  They need to know if you have any of these conditions:  -bleeding in the brain  -blood clotting problems  -kidney disease  -vision problems  -an unusual allergic reaction to tranexamic acid, other medicines, foods, dyes, or preservatives  -pregnant or trying to get pregnant  -breast-feeding  How should I use this medicine?  Take this medicine by mouth with a glass of water. Follow the directions on the prescription label. Do not cut, crush, or chew this medicine. You can take it with or without food. If it upsets your stomach, take it with food. Take your medicine at regular intervals. Do not take it more often than directed. Do not stop taking except on your doctor's advice.  Do not take this medicine until your period has started. Do not take it for more than 5 days in a row. Do not take this medicine when you do not have your period.  Talk to your pediatrician regarding the use of this medicine in children. While this drug may be prescribed for female children as young as 12 years of age for selected conditions, precautions do apply.  Overdosage: If you think you have taken too much of this medicine contact a poison control center or emergency room at once.  NOTE: This medicine is only for you. Do not share this medicine with others.  What if I miss a dose?  If you miss a dose, take it when you remember, and then take your next dose at least 6 hours later. Do not take more than 2 tablets at a time to make up for missed doses.  What may interact with this medicine?  Do not take  this medicine with any of the following medications:  -estrogens  -birth control pills, patches, injections, rings or other devices that contain both an estrogen and a progestin  This medicine may also interact with the following medications:  -certain medicines used to help your blood clot  -tretinoin (taken by mouth)  This list may not describe all possible interactions. Give your health care provider a list of all the medicines, herbs, non-prescription drugs, or dietary supplements you use. Also tell them if you smoke, drink alcohol, or use illegal drugs. Some items may interact with your medicine.  What should I watch for while using this medicine?  Tell your doctor or healthcare professional if your symptoms do not start to get better or if they get worse.  Tell your doctor or healthcare professional if you notice any eye problems while taking this medicine. Your doctor will refer you to an eye doctor who will examine your eyes.  What side effects may I notice from receiving this medicine?  Side effects that you should report to your doctor or health care professional as soon as possible:  -allergic reactions like skin rash, itching or hives, swelling of the face, lips, or tongue  -breathing difficulties  -changes in vision  -sudden or severe pain in the chest, legs, head, or groin  -unusually weak or tired  Side effects that   usually do not require medical attention (report to your doctor or health care professional if they continue or are bothersome):  -back pain  -headache  -muscle or joint aches  -sinus and nasal problems  -stomach pain  -tiredness  This list may not describe all possible side effects. Call your doctor for medical advice about side effects. You may report side effects to FDA at 1-800-FDA-1088.  Where should I keep my medicine?  Keep out of the reach of children.  Store at room temperature between 15 and 30 degrees C (59 and 86 degrees F). Throw away any unused medicine after the expiration  date.  NOTE: This sheet is a summary. It may not cover all possible information. If you have questions about this medicine, talk to your doctor, pharmacist, or health care provider.  © 2019 [employer]vier/Gold Standard (2015-06-23 09:12:15)

## 2018-11-03 NOTE — Telephone Encounter (Signed)
Pt calling; was seen today and rx sent to pharm; too expensive; has found cheaper pharm she would like them sent to.  Please call for pharm. [phone] Pt states rx has already been sent to cheaper [facility].

## 2018-11-03 NOTE — Addendum Note (Signed)
Addended by: Nadara Mustard on: 11/03/2018 10:30 AM   Modules accepted: Orders

## 2018-11-04 LAB — CYTOLOGY - PAP
Adequacy: ABSENT
Chlamydia: NEGATIVE
Diagnosis: NEGATIVE
Neisseria Gonorrhea: NEGATIVE
Trichomonas: NEGATIVE

## 2018-11-05 LAB — URINE CULTURE

## 2018-11-07 ENCOUNTER — Telehealth: Payer: Self-pay

## 2018-11-07 ENCOUNTER — Other Ambulatory Visit: Payer: Self-pay | Admitting: Obstetrics & Gynecology

## 2018-11-07 MED ORDER — MELOXICAM 7.5 MG PO TABS: 7.5000 mg | ORAL_TABLET | Freq: Two times a day (BID) | ORAL | 4 refills | Status: DC | PRN

## 2018-11-07 NOTE — Telephone Encounter (Signed)
Continue taking the Lysteda 2 pills three times a day for up to five days at the start of each cycle.  This was nly the first cycle of trying this regimen. Will call in additional pain medicine Meloxicam for pain to see if works better than Ibuprofen.

## 2018-11-07 NOTE — Telephone Encounter (Signed)
Pt aware. Was taking lysteda 1 pill three times a day.

## 2018-11-07 NOTE — Telephone Encounter (Signed)
Pt calling c/o rx to slow period down isn't working; states she can't take the pain.  Phrarm is [facility] in [place].  [phone]

## 2018-11-14 ENCOUNTER — Ambulatory Visit: Payer: Medicaid Other | Admitting: Obstetrics and Gynecology

## 2018-12-26 NOTE — Telephone Encounter (Signed)
Pt has since been seen.  Msg closed.

## 2019-03-03 ENCOUNTER — Telehealth: Payer: Self-pay

## 2019-03-03 ENCOUNTER — Other Ambulatory Visit: Payer: Self-pay | Admitting: Obstetrics & Gynecology

## 2019-03-03 MED ORDER — MEDROXYPROGESTERONE ACETATE 150 MG/ML IM SUSP: 150.0000 mg | INTRAMUSCULAR | 3 refills | Status: DC

## 2019-03-03 NOTE — Telephone Encounter (Signed)
Pt aware.

## 2019-03-03 NOTE — Telephone Encounter (Signed)
Can you prescribe depo for pt?

## 2019-03-03 NOTE — Telephone Encounter (Signed)
Yes, her annual is up to date as of 11/2018 w PAP.  Depo eRx w refills to [facility]

## 2019-03-03 NOTE — Telephone Encounter (Signed)
Pt calling to see how she goes about getting the depo shot.  [phone]

## 2019-03-03 NOTE — Telephone Encounter (Signed)
Patient is calling needing an refill on depo lasted seen for pap on 11/03/18 with [facility]. Please advise. Patient would like to be call about prescription being sent to pharmacy

## 2019-03-04 ENCOUNTER — Telehealth: Payer: Self-pay

## 2019-03-04 NOTE — Telephone Encounter (Signed)
Pt calling; wants to make her dog a service animal for emotional support  b/c of her severe cramps.  Needs doctor's note to get process started.  [phone]

## 2019-03-04 NOTE — Telephone Encounter (Signed)
Please advise 

## 2019-03-09 NOTE — Telephone Encounter (Signed)
VM not set up yet.

## 2019-03-10 NOTE — Telephone Encounter (Signed)
Tried calling again today.  Vm not set up yet.

## 2019-03-13 NOTE — Telephone Encounter (Signed)
Pt aware.

## 2019-03-23 NOTE — Telephone Encounter (Signed)
Notified [facility] of rx sent in error. Requested to void rx all together as it has been given to [employer].

## 2019-03-23 NOTE — Telephone Encounter (Addendum)
[facility] [place] calling patient is there to p/u rx for Depo Provera that was sent in 03/03/2019. Advised rx was sent to pharmacy on [address] Yvonne Herrera). Patient doesn't use [facility] any longer per pharmacist. Verbal given of original rx w/3 refills 03/03/2019. Pharmacy changes to [facility].

## 2019-09-05 ENCOUNTER — Other Ambulatory Visit: Payer: Self-pay | Admitting: Obstetrics and Gynecology

## 2019-11-03 ENCOUNTER — Other Ambulatory Visit: Payer: Medicaid Other

## 2020-06-29 ENCOUNTER — Other Ambulatory Visit: Payer: Self-pay

## 2020-06-29 ENCOUNTER — Encounter: Payer: Self-pay | Admitting: Obstetrics and Gynecology

## 2020-06-29 ENCOUNTER — Ambulatory Visit (INDEPENDENT_AMBULATORY_CARE_PROVIDER_SITE_OTHER): Payer: 59 | Admitting: Obstetrics and Gynecology

## 2020-06-29 ENCOUNTER — Other Ambulatory Visit (HOSPITAL_COMMUNITY)
Admission: RE | Admit: 2020-06-29 | Discharge: 2020-06-29 | Disposition: A | Discharge status: Home / Self Care | Payer: Medicaid Other | Source: Ambulatory Visit | Attending: Obstetrics and Gynecology | Admitting: Obstetrics and Gynecology

## 2020-06-29 VITALS — BP 112/70 | Ht 63.0 in | Wt 187.0 lb

## 2020-06-29 DIAGNOSIS — L705 Acne excoriee des jeunes filles: Secondary | ICD-10-CM

## 2020-06-29 DIAGNOSIS — Z113 Encounter for screening for infections with a predominantly sexual mode of transmission: Secondary | ICD-10-CM | POA: Diagnosis not present

## 2020-06-29 DIAGNOSIS — N946 Dysmenorrhea, unspecified: Secondary | ICD-10-CM | POA: Diagnosis not present

## 2020-06-29 DIAGNOSIS — Z01419 Encounter for gynecological examination (general) (routine) without abnormal findings: Secondary | ICD-10-CM | POA: Diagnosis not present

## 2020-06-29 DIAGNOSIS — Z0000 Encounter for general adult medical examination without abnormal findings: Secondary | ICD-10-CM

## 2020-06-29 NOTE — Progress Notes (Signed)
Annual Exam

## 2020-06-29 NOTE — Patient Instructions (Addendum)
[facility] of [facility] [place] [facility] for Calcium and Vitamin D  Age (yr) Calcium Recommended Dietary Allowance (mg/day) Vitamin D Recommended Dietary Allowance (international units/day)  9-18 1,300 600  19-50 1,000 600  51-70 1,200 600  71 and older 1,200 800  Data from Institute of Medicine. Dietary reference intakes: calcium, vitamin D. [place], [place]: [employer]; 2011.    Exercising to Stay Healthy To become healthy and stay healthy, it is recommended that you do moderate-intensity and vigorous-intensity exercise. You can tell that you are exercising at a moderate intensity if your heart starts beating faster and you start breathing faster but can still hold a conversation. You can tell that you are exercising at a vigorous intensity if you are breathing much harder and faster and cannot hold a conversation while exercising. Exercising regularly is important. It has many health benefits, such as:  Improving overall fitness, flexibility, and endurance.  Increasing bone density.  Helping with weight control.  Decreasing body fat.  Increasing muscle strength.  Reducing stress and tension.  Improving overall health. How often should I exercise? Choose an activity that you enjoy, and set realistic goals. Your health care provider can help you make an activity plan that works for you. Exercise regularly as told by your health care provider. This may include:  Doing strength training two times a week, such as: ? Lifting weights. ? Using resistance bands. ? Push-ups. ? Sit-ups. ? Yoga.  Doing a certain intensity of exercise for a given amount of time. Choose from these options: ? A total of 150 minutes of moderate-intensity exercise every week. ? A total of 75 minutes of vigorous-intensity exercise every week. ? A mix of moderate-intensity and vigorous-intensity exercise every week. Children, pregnant women, people who have not exercised  regularly, people who are overweight, and older adults may need to talk with a health care provider about what activities are safe to do. If you have a medical condition, be sure to talk with your health care provider before you start a new exercise program. What are some exercise ideas? Moderate-intensity exercise ideas include:  Walking 1 mile (1.6 km) in about 15 minutes.  Biking.  Hiking.  Golfing.  Dancing.  Water aerobics. Vigorous-intensity exercise ideas include:  Walking 4.5 miles (7.2 km) or more in about 1 hour.  Jogging or running 5 miles (8 km) in about 1 hour.  Biking 10 miles (16.1 km) or more in about 1 hour.  Lap swimming.  Roller-skating or in-line skating.  Cross-country skiing.  Vigorous competitive sports, such as football, basketball, and soccer.  Jumping rope.  Aerobic dancing.   What are some everyday activities that can help me to get exercise?  [place] work, such as: ? Pushing a Conservation officer, nature. ? Raking and bagging leaves.  Washing your car.  Pushing a stroller.  Shoveling snow.  Gardening.  Washing windows or floors. How can I be more active in my day-to-day activities?  Use stairs instead of an elevator.  Take a walk during your lunch break.  If you drive, park your car farther away from your work or school.  If you take public transportation, get off one stop early and walk the rest of the way.  Stand up or walk around during all of your indoor phone calls.  Get up, stretch, and walk around every 30 minutes throughout the day.  Enjoy exercise with a friend. Support to continue exercising will help you keep a regular routine of activity. What guidelines  can I follow while exercising?  Before you start a new exercise program, talk with your health care provider.  Do not exercise so much that you hurt yourself, feel dizzy, or get very short of breath.  Wear comfortable clothes and wear shoes with good support.  Drink plenty of  water while you exercise to prevent dehydration or heat stroke.  Work out until your breathing and your heartbeat get faster. Where to find more information  U.S. Department of Health and Human Services: www.hhs.gov  Centers for Disease Control and Prevention (CDC): www.cdc.gov Summary  Exercising regularly is important. It will improve your overall fitness, flexibility, and endurance.  Regular exercise also will improve your overall health. It can help you control your weight, reduce stress, and improve your bone density.  Do not exercise so much that you hurt yourself, feel dizzy, or get very short of breath.  Before you start a new exercise program, talk with your health care provider. This information is not intended to replace advice given to you by your health care provider. Make sure you discuss any questions you have with your health care provider. Document Revised: 05/03/2017 Document Reviewed: 04/11/2017 Elsevier Patient Education  2021 Elsevier Inc.   Budget-Friendly Healthy Eating There are many ways to save money at the grocery store and continue to eat healthy. You can be successful if you:  Plan meals according to your budget.  Make a grocery list and only purchase food according to your grocery list.  Prepare food yourself at home. What are tips for following this plan? Reading food labels  Compare food labels between brand name foods and the store brand. Often the nutritional value is the same, but the store brand is lower cost.  Look for products that do not have added sugar, fat, or salt (sodium). These often cost the same but are healthier for you. Products may be labeled as: ? Sugar-free. ? Nonfat. ? Low-fat. ? Sodium-free. ? Low-sodium.  Look for lean ground beef labeled as at least 92% lean and 8% fat. Shopping  Buy only the items on your grocery list and go only to the areas of the store that have the items on your list.  Use coupons only for  foods and brands you normally buy. Avoid buying items you wouldn't normally buy simply because they are on sale.  Check online and in newspapers for weekly deals.  Buy healthy items from the bulk bins when available, such as herbs, spices, flour, pasta, nuts, and dried fruit.  Buy fruits and vegetables that are in season. Prices are usually lower on in-season produce.  Look at the unit price on the price tag. Use it to compare different brands and sizes to find out which item is the best deal.  Choose healthy items that are often low-cost, such as carrots, potatoes, apples, bananas, and oranges. Dried or canned beans are a low-cost protein source.  Buy in bulk and freeze extra food. Items you can buy in bulk include meats, fish, poultry, frozen fruits, and frozen vegetables.  Avoid buying "ready-to-eat" foods, such as pre-cut fruits and vegetables and pre-made salads.  If possible, shop around to discover where you can find the best prices. Consider other retailers such as dollar stores, larger wholesale stores, local fruit and vegetable stands, and farmers markets.  Do not shop when you are hungry. If you shop while hungry, it may be hard to stick to your list and budget.  Resist impulse buying. Use your grocery   list as your official plan for the week.  Buy a variety of vegetables and fruits by purchasing fresh, frozen, and canned items.  Look at the top and bottom shelves for deals. Foods at eye level (eye level of an adult or child) are usually more expensive.  Be efficient with your time when shopping. The more time you spend at the store, the more money you are likely to spend.  To save money when choosing more expensive foods like meats and dairy: ? Choose cheaper cuts of meat, such as bone-in chicken thighs and drumsticks instead of skinless and boneless chicken. When you are ready to prepare the chicken, you can remove the skin yourself to make it healthier. ? Choose lean meats  like chicken or [place] instead of beef. ? Choose canned seafood, such as tuna, salmon, or sardines. ? Buy eggs as a low-cost source of protein. ? Buy dried beans and peas, such as lentils, split peas, or kidney beans instead of meats. Dried beans and peas are a good alternative source of protein. ? Buy the larger tubs of yogurt instead of individual-sized containers.  Choose water instead of sodas and other sweetened beverages.  Avoid buying chips, cookies, and other "junk food." These items are usually expensive and not healthy.   Cooking  Make extra food and freeze the extras in meal-sized containers or in individual portions for fast meals and snacks.  Pre-cook on days when you have extra time to prepare meals in advance. You can keep these meals in the fridge or freezer and reheat for a quick meal.  When you come home from the grocery store, wash, peel, and cut fruits and vegetables so they are ready to use and eat. This will help reduce food waste. Meal planning  Do not eat out or get fast food. Prepare food at home.  Make a grocery list and make sure to bring it with you to the store. If you have a smart phone, you could use your phone to create your shopping list.  Plan meals and snacks according to a grocery list and budget you create.  Use leftovers in your meal plan for the week.  Look for recipes where you can cook once and make enough food for two meals.  Prepare budget-friendly types of meals like stews, casseroles, and stir-fry dishes.  Try some meatless meals or try "no cook" meals like salads.  Make sure that half your plate is filled with fruits or vegetables. Choose from fresh, frozen, or canned fruits and vegetables. If eating canned, remember to rinse them before eating. This will remove any excess salt added for packaging. Summary  Eating healthy on a budget is possible if you plan your meals according to your budget, purchase according to your budget and  grocery list, and prepare food yourself.  Tips for buying more food on a limited budget include buying generic brands, using coupons only for foods you normally buy, and buying healthy items from the bulk bins when available.  Tips for buying cheaper food to replace expensive food include choosing cheaper, lean cuts of meat, and buying dried beans and peas. This information is not intended to replace advice given to you by your health care provider. Make sure you discuss any questions you have with your health care provider. Document Revised: 03/03/2020 Document Reviewed: 03/03/2020 Elsevier Patient Education  [place] protect organs, store calcium, anchor muscles, and support the whole body. Keeping your bones  strong is important, especially as you get older. You can take actions to help keep your bones strong and healthy. Why is keeping my bones healthy important? Keeping your bones healthy is important because your body constantly replaces bone cells. Cells get old, and new cells take their place. As we age, we lose bone cells because the body may not be able to make enough new cells to replace the old cells. The amount of bone cells and bone tissue you have is referred to as bone mass. The higher your bone mass, the stronger your bones. The aging process leads to an overall loss of bone mass in the body, which can increase the likelihood of:  Joint pain and stiffness.  Broken bones.  A condition in which the bones become weak and brittle (osteoporosis). A large decline in bone mass occurs in older adults. In women, it occurs about the time of menopause.   What actions can I take to keep my bones healthy? Good health habits are important for maintaining healthy bones. This includes eating nutritious foods and exercising regularly. To have healthy bones, you need to get enough of the right minerals and vitamins. Most nutrition experts recommend getting these  nutrients from the foods that you eat. In some cases, taking supplements may also be recommended. Doing certain types of exercise is also important for bone health. What are the nutritional recommendations for healthy bones? Eating a well-balanced diet with plenty of calcium and vitamin D will help to protect your bones. Nutritional recommendations vary from person to person. Ask your health care provider what is healthy for you. Here are some general guidelines. Get enough calcium Calcium is the most important (essential) mineral for bone health. Most people can get enough calcium from their diet, but supplements may be recommended for people who are at risk for osteoporosis. Good sources of calcium include:  Dairy products, such as low-fat or nonfat milk, cheese, and yogurt.  Dark green leafy vegetables, such as bok choy and broccoli.  Calcium-fortified foods, such as orange juice, cereal, bread, soy beverages, and tofu products.  Nuts, such as almonds. Follow these recommended amounts for daily calcium intake:  Children, age 1-3: 700 mg.  Children, age 4-8: 1,000 mg.  Children, age 9-13: 1,300 mg.  Teens, age 14-18: 1,300 mg.  Adults, age 19-50: 1,000 mg.  Adults, age 51-70: ? Men: 1,000 mg. ? Women: 1,200 mg.  Adults, age 71 or older: 1,200 mg.  Pregnant and breastfeeding females: ? Teens: 1,300 mg. ? Adults: 1,000 mg. Get enough vitamin D Vitamin D is the most essential vitamin for bone health. It helps the body absorb calcium. Sunlight stimulates the skin to make vitamin D, so be sure to get enough sunlight. If you live in a cold climate or you do not get outside often, your health care provider may recommend that you take vitamin D supplements. Good sources of vitamin D in your diet include:  Egg yolks.  Saltwater fish.  Milk and cereal fortified with vitamin D. Follow these recommended amounts for daily vitamin D intake:  Children and teens, age 1-18: 600  international units.  Adults, age 50 or younger: 400-800 international units.  Adults, age 51 or older: 800-1,000 international units. Get other important nutrients Other nutrients that are important for bone health include:  Phosphorus. This mineral is found in meat, poultry, dairy foods, nuts, and legumes. The recommended daily intake for adult men and adult women is 700 mg.  Magnesium. This mineral   is found in seeds, nuts, dark green vegetables, and legumes. The recommended daily intake for adult men is 400-420 mg. For adult women, it is 310-320 mg.  Vitamin K. This vitamin is found in green leafy vegetables. The recommended daily intake is 120 mg for adult men and 90 mg for adult women.   What type of physical activity is best for building and maintaining healthy bones? Weight-bearing and strength-building activities are important for building and maintaining healthy bones. Weight-bearing activities cause muscles and bones to work against gravity. Strength-building activities increase the strength of the muscles that support bones. Weight-bearing and muscle-building activities include:  Walking and hiking.  Jogging and running.  Dancing.  Gym exercises.  Lifting weights.  Tennis and racquetball.  Climbing stairs.  Aerobics. Adults should get at least 30 minutes of moderate physical activity on most days. Children should get at least 60 minutes of moderate physical activity on most days. Ask your health care provider what type of exercise is best for you.   How can I find out if my bone mass is low? Bone mass can be measured with an X-ray test called a bone mineral density (BMD) test. This test is recommended for all women who are age 75 or older. It may also be recommended for:  Men who are age 18 or older.  People who are at risk for osteoporosis because of: ? Having bones that break easily. ? Having a long-term disease that weakens bones, such as kidney disease or  rheumatoid arthritis. ? Having menopause earlier than normal. ? Taking medicine that weakens bones, such as steroids, thyroid hormones, or hormone treatment for breast cancer or prostate cancer. ? Smoking. ? Drinking three or more alcoholic drinks a day. If you find that you have a low bone mass, you may be able to prevent osteoporosis or further bone loss by changing your diet and lifestyle. Where can I find more information? For more information, check out the following websites:  National Osteoporosis Foundation: https://carlson-fletcher.info/  [employer] of Health: www.bones.http://www.myers.net/  International Osteoporosis Foundation: Investment banker, operational.iofbonehealth.org Summary  The aging process leads to an overall loss of bone mass in the body, which can increase the likelihood of broken bones and osteoporosis.  Eating a well-balanced diet with plenty of calcium and vitamin D will help to protect your bones.  Weight-bearing and strength-building activities are also important for building and maintaining strong bones.  Bone mass can be measured with an X-ray test called a bone mineral density (BMD) test. This information is not intended to replace advice given to you by your health care provider. Make sure you discuss any questions you have with your health care provider. Document Revised: 06/17/2017 Document Reviewed: 06/17/2017 Elsevier Patient Education  2021 Elsevier Inc.   http://APA.org/depression-guideline"> https://clinicalkey.com"> http://point-of-care.elsevierperformancemanager.com/skills/"> http://point-of-care.elsevierperformancemanager.com">  Managing Depression, Adult Depression is a mental health condition that affects your thoughts, feelings, and actions. Being diagnosed with depression can bring you relief if you did not know why you have felt or behaved a certain way. It could also leave you feeling overwhelmed with uncertainty about your future. Preparing yourself to manage your symptoms  can help you feel more positive about your future. How to manage lifestyle changes Managing stress Stress is your body's reaction to life changes and events, both good and bad. Stress can add to your feelings of depression. Learning to manage your stress can help lessen your feelings of depression. Try some of the following approaches to reducing your stress (stress reduction techniques):  Listen to music that you enjoy and that inspires you.  Try using a meditation app or take a meditation class.  Develop a practice that helps you connect with your spiritual self. Walk in nature, pray, or go to a place of worship.  Do some deep breathing. To do this, inhale slowly through your nose. Pause at the top of your inhale for a few seconds and then exhale slowly, letting your muscles relax.  Practice yoga to help relax and work your muscles. Choose a stress reduction technique that suits your lifestyle and personality. These techniques take time and practice to develop. Set aside 5-15 minutes a day to do them. Therapists can offer training in these techniques. Other things you can do to manage stress include:  Keeping a stress diary.  Knowing your limits and saying no when you think something is too much.  Paying attention to how you react to certain situations. You may not be able to control everything, but you can change your reaction.  Adding humor to your life by watching funny films or TV shows.  Making time for activities that you enjoy and that relax you.   Medicines Medicines, such as antidepressants, are often a part of treatment for depression.  Talk with your pharmacist or health care provider about all the medicines, supplements, and herbal products that you take, their possible side effects, and what medicines and other products are safe to take together.  Make sure to report any side effects you may have to your health care provider. Relationships Your health care provider may  suggest family therapy, couples therapy, or individual therapy as part of your treatment. How to recognize changes Everyone responds differently to treatment for depression. As you recover from depression, you may start to:  Have more interest in doing activities.  Feel less hopeless.  Have more energy.  Overeat less often, or have a better appetite.  Have better mental focus. It is important to recognize if your depression is not getting better or is getting worse. The symptoms you had in the beginning may return, such as:  Tiredness (fatigue) or low energy.  Eating too much or too little.  Sleeping too much or too little.  Feeling restless, agitated, or hopeless.  Trouble focusing or making decisions.  Unexplained physical complaints.  Feeling irritable, angry, or aggressive. If you or your family members notice these symptoms coming back, let your health care provider know right away. Follow these instructions at home: Activity  Try to get some form of exercise each day, such as walking, biking, swimming, or lifting weights.  Practice stress reduction techniques.  Engage your mind by taking a class or doing some volunteer work.   Lifestyle  Get the right amount and quality of sleep.  Cut down on using caffeine, tobacco, alcohol, and other potentially harmful substances.  Eat a healthy diet that includes plenty of vegetables, fruits, whole grains, low-fat dairy products, and lean protein. Do not eat a lot of foods that are high in solid fats, added sugars, or salt (sodium). General instructions  Take over-the-counter and prescription medicines only as told by your health care provider.  Keep all follow-up visits as told by your health care provider. This is important. Where to find support Talking to others Friends and family members can be sources of support and guidance. Talk to trusted friends or family members about your condition. Explain your symptoms to  them, and let them know that you are working with a   health care provider to treat your depression. Tell friends and family members how they also can be helpful.   Finances  Find appropriate mental health providers that fit with your financial situation.  Talk with your health care provider about options to get reduced prices on your medicines. Where to find more information You can find support in your area from:  Anxiety and Depression Association of America (ADAA): www.adaa.org  Mental Health America: www.mentalhealthamerica.net  [employer] on Mental Illness: www.nami.org Contact a health care provider if:  You stop taking your antidepressant medicines, and you have any of these symptoms: ? Nausea. ? Headache. ? Light-headedness. ? Chills and body aches. ? Not being able to sleep (insomnia).  You or your friends and family think your depression is getting worse. Get help right away if:  You have thoughts of hurting yourself or others. If you ever feel like you may hurt yourself or others, or have thoughts about taking your own life, get help right away. Go to your nearest emergency department or:  Call your local emergency services (911 in the [place]  Call a suicide crisis helpline, such as the National Suicide Prevention Lifeline at 352-463-8933. This is open 24 hours a day in the [place]  Text the Crisis Text Line at (617)584-5276 (in the [place] Summary  If you are diagnosed with depression, preparing yourself to manage your symptoms is a good way to feel positive about your future.  Work with your health care provider on a management plan that includes stress reduction techniques, medicines (if applicable), therapy, and healthy lifestyle habits.  Keep talking with your health care provider about how your treatment is working.  If you have thoughts about taking your own life, call a suicide crisis helpline or text a crisis text line. This information is not intended to  replace advice given to you by your health care provider. Make sure you discuss any questions you have with your health care provider. Document Revised: 04/01/2019 Document Reviewed: 04/01/2019 Elsevier Patient Education  2021 Elsevier Inc.   Managing Anxiety, Adult After being diagnosed with an anxiety disorder, you may be relieved to know why you have felt or behaved a certain way. You may also feel overwhelmed about the treatment ahead and what it will mean for your life. With care and support, you can manage this condition and recover from it. How to manage lifestyle changes Managing stress and anxiety Stress is your body's reaction to life changes and events, both good and bad. Most stress will last just a few hours, but stress can be ongoing and can lead to more than just stress. Although stress can play a major role in anxiety, it is not the same as anxiety. Stress is usually caused by something external, such as a deadline, test, or competition. Stress normally passes after the triggering event has ended.  Anxiety is caused by something internal, such as imagining a terrible outcome or worrying that something will go wrong that will devastate you. Anxiety often does not go away even after the triggering event is over, and it can become long-term (chronic) worry. It is important to understand the differences between stress and anxiety and to manage your stress effectively so that it does not lead to an anxious response. Talk with your health care provider or a counselor to learn more about reducing anxiety and stress. He or she may suggest tension reduction techniques, such as:  Music therapy. This can include creating or listening to music that  you enjoy and that inspires you.  Mindfulness-based meditation. This involves being aware of your normal breaths while not trying to control your breathing. It can be done while sitting or walking.  Centering prayer. This involves focusing on a word,  phrase, or sacred image that means something to you and brings you peace.  Deep breathing. To do this, expand your stomach and inhale slowly through your nose. Hold your breath for 3-5 seconds. Then exhale slowly, letting your stomach muscles relax.  Self-talk. This involves identifying thought patterns that lead to anxiety reactions and changing those patterns.  Muscle relaxation. This involves tensing muscles and then relaxing them. Choose a tension reduction technique that suits your lifestyle and personality. These techniques take time and practice. Set aside 5-15 minutes a day to do them. Therapists can offer counseling and training in these techniques. The training to help with anxiety may be covered by some insurance plans. Other things you can do to manage stress and anxiety include:  Keeping a stress/anxiety diary. This can help you learn what triggers your reaction and then learn ways to manage your response.  Thinking about how you react to certain situations. You may not be able to control everything, but you can control your response.  Making time for activities that help you relax and not feeling guilty about spending your time in this way.  Visual imagery and yoga can help you stay calm and relax.   Medicines Medicines can help ease symptoms. Medicines for anxiety include:  Anti-anxiety drugs.  Antidepressants. Medicines are often used as a primary treatment for anxiety disorder. Medicines will be prescribed by a health care provider. When used together, medicines, psychotherapy, and tension reduction techniques may be the most effective treatment. Relationships Relationships can play a big part in helping you recover. Try to spend more time connecting with trusted friends and family members. Consider going to couples counseling, taking family education classes, or going to family therapy. Therapy can help you and others better understand your condition. How to recognize  changes in your anxiety Everyone responds differently to treatment for anxiety. Recovery from anxiety happens when symptoms decrease and stop interfering with your daily activities at home or work. This may mean that you will start to:  Have better concentration and focus. Worry will interfere less in your daily thinking.  Sleep better.  Be less irritable.  Have more energy.  Have improved memory. It is important to recognize when your condition is getting worse. Contact your health care provider if your symptoms interfere with home or work and you feel like your condition is not improving. Follow these instructions at home: Activity  Exercise. Most adults should do the following: ? Exercise for at least 150 minutes each week. The exercise should increase your heart rate and make you sweat (moderate-intensity exercise). ? Strengthening exercises at least twice a week.  Get the right amount and quality of sleep. Most adults need 7-9 hours of sleep each night. Lifestyle  Eat a healthy diet that includes plenty of vegetables, fruits, whole grains, low-fat dairy products, and lean protein. Do not eat a lot of foods that are high in solid fats, added sugars, or salt.  Make choices that simplify your life.  Do not use any products that contain nicotine or tobacco, such as cigarettes, e-cigarettes, and chewing tobacco. If you need help quitting, ask your health care provider.  Avoid caffeine, alcohol, and certain over-the-counter cold medicines. These may make you feel worse. Ask your  pharmacist which medicines to avoid.   General instructions  Take over-the-counter and prescription medicines only as told by your health care provider.  Keep all follow-up visits as told by your health care provider. This is important. Where to find support You can get help and support from these sources:  Self-help groups.  Online and [employer].  A trusted spiritual leader.  Couples  counseling.  Family education classes.  Family therapy. Where to find more information You may find that joining a support group helps you deal with your anxiety. The following sources can help you locate counselors or support groups near you:  Mental Health America: www.mentalhealthamerica.net  Anxiety and Depression Association of [place] (ADAA): ProgramCam.de  [employer] on Mental Illness (NAMI): www.nami.org Contact a health care provider if you:  Have a hard time staying focused or finishing daily tasks.  Spend many hours a day feeling worried about everyday life.  Become exhausted by worry.  Start to have headaches, feel tense, or have nausea.  Urinate more than normal.  Have diarrhea. Get help right away if you have:  A racing heart and shortness of breath.  Thoughts of hurting yourself or others. If you ever feel like you may hurt yourself or others, or have thoughts about taking your own life, get help right away. You can go to your nearest emergency department or call:  Your local emergency services (911 in the [place]  A suicide crisis helpline, such as the National Suicide Prevention Lifeline at 3400558756. This is open 24 hours a day. Summary  Taking steps to learn and use tension reduction techniques can help calm you and help prevent triggering an anxiety reaction.  When used together, medicines, psychotherapy, and tension reduction techniques may be the most effective treatment.  Family, friends, and partners can play a big part in helping you recover from an anxiety disorder. This information is not intended to replace advice given to you by your health care provider. Make sure you discuss any questions you have with your health care provider. Document Revised: 10/21/2018 Document Reviewed: 10/21/2018 Elsevier Patient Education  2021 Elsevier Inc.   Diagnostic Laparoscopy Diagnostic laparoscopy is a procedure to diagnose problems in the  abdomen. It might be done for a variety of reasons, such as to look for scar tissue, a reason for abdominal pain, an abdominal mass or tumor, or fluid in the abdomen (ascites). This procedure may also be done to remove a tissue sample from the liver to look at under a microscope (biopsy). During the procedure, a thin, flexible tube that has a light and a camera on the end (laparoscope) is inserted through a small incision in the abdomen. The image from the camera is shown on a monitor to help the surgeon see inside the body. Tell a health care provider about:  Any allergies you have.  All medicines you are taking, including vitamins, herbs, eye drops, creams, and over-the-counter medicines.  Any problems you or family members have had with anesthetic medicines.  Any blood disorders you have.  Any surgeries you have had.  Any medical conditions you have.  Whether you are pregnant or may be pregnant. What are the risks? Generally, this is a safe procedure. However, problems may occur, including:  Infection.  Bleeding.  Allergic reactions to medicines or dyes.  Damage to abdominal structures or organs, such as the intestines, liver, stomach, or spleen. What happens before the procedure? Staying hydrated Follow instructions from your health care provider about hydration,  which may include:  Up to 2 hours before the procedure - you may continue to drink clear liquids, such as water, clear fruit juice, black coffee, and plain tea.   Eating and drinking restrictions Follow instructions from your health care provider about eating and drinking, which may include:  8 hours before the procedure - stop eating heavy meals or foods, such as meat, fried foods, or fatty foods.  6 hours before the procedure - stop eating light meals or foods, such as toast or cereal.  6 hours before the procedure - stop drinking milk or drinks that contain milk.  2 hours before the procedure - stop drinking  clear liquids. Medicines Ask your health care provider about:  Changing or stopping your regular medicines. This is especially important if you are taking diabetes medicines or blood thinners.  Taking medicines such as aspirin and ibuprofen. These medicines can thin your blood. Do not take these medicines unless your health care provider tells you to take them.  Taking over-the-counter medicines, vitamins, herbs, and supplements. General instructions  Ask your health care provider: ? How your surgery site will be marked. ? What steps will be taken to help prevent infection. These steps may include:  Removing hair at the surgery site.  Washing skin with a germ-killing soap.  Taking antibiotic medicine.  Plan to have a responsible adult take you home from the hospital or clinic.  Plan to have a responsible adult care for you for the time you are told after you leave the hospital or clinic. This is important. What happens during the procedure?  An IV will be inserted into one of your veins.  You will be given one or more of the following: ? A medicine to help you relax (sedative). ? A medicine to numb the area (local anesthetic). ? A medicine to make you fall asleep (general anesthetic).  A breathing tube will be placed down your throat to help you breathe during the procedure.  Your abdomen will be filled with an air-like gas so that your abdomen expands. This will give the surgeon more room to operate and will make your organs easier to see.  Many small incisions will be made in your abdomen.  A laparoscope and other surgical instruments will be inserted into your abdomen through these incisions.  A biopsy may be done. This will depend on the reason why you are having this procedure.  The laparoscope and other instruments will be removed from your abdomen.  The air-like gas will be released from your abdomen.  Your incisions will be closed with stitches (sutures), skin  glue, or surgical tapes and covered with a bandage (dressing).  Your breathing tube will be removed. The procedure may vary among health care providers and hospitals.   What happens after the procedure?  Your blood pressure, heart rate, breathing rate, and blood oxygen level will be monitored until you leave the hospital or clinic.  If you were given a sedative during the procedure, it can affect you for several hours. Do not drive or operate machinery until your health care provider says that it is safe.  It is up to you to get the results of your procedure. Ask your health care provider, or the department that is doing the procedure, when your results will be ready. Summary  Diagnostic laparoscopy is a procedure to diagnose problems in the abdomen using a thin, flexible tube that has a light and a camera on the end (laparoscope).  Follow instructions from your health care provider about how to prepare for the procedure.  Plan to have a responsible adult care for you for the time you are told after you leave the hospital or clinic. This is important. This information is not intended to replace advice given to you by your health care provider. Make sure you discuss any questions you have with your health care provider. Document Revised: 01/15/2020 Document Reviewed: 01/15/2020 Elsevier Patient Education  [address]    [facility], [facility] Same Day Access Hours:  Monday, Wednesday and Friday, 8am - 3pm Walk-In Crisis Hours: 7 days/wk, 8am - 8pm [address] [place], [place] [address] [phone]  [employer] [phone]  Mobile Crisis Unit [phone]   Therapists/Counselors/Psychologists  [facility] [address], [facility] [address] [place], [place] [address] [phone]  Karen [place], [place]  & Jacqlyn Krauss [place]    [phone]        [address]       [place], [place] [address]        Ival Bible, CSW [phone] [address] [place], [place] [address]  Harle Battiest, [place]        [phone]        [address]      [place], [place] [address]        Chyrel Masson, MS [phone] [address] [place], [place] [address]   Oscar La, LMFT       [phone]        [address]       [place], [place] [address]        Felecia Jan [phone] [address] [place], [place] [address]  Lester Foreman        [phone]        [address]       [place], [place] [address]        Kerin Salen [phone] [address] [place], [place] [address]  Tyron Russell, PsyD       [phone]        [address]       [place], [place] [address]        Elita Quick, LPC [phone] [address]  [place], [place] [address]   Debarah Crape        [phone]        [address] [place], [place] [address]         Rosana Hoes [facility] [phone] [email or web address]   Sation Konchella       [phone]        [address]       [place], [place] [address]        Morton Stall [facility] [phone] [email or web address]

## 2020-06-29 NOTE — Progress Notes (Signed)
Gynecology Annual Exam  PCP: Patient, No Pcp Per  Chief Complaint:  Chief Complaint  Patient presents with  . Gynecologic Exam    Annual Exam    History of Present Illness: Patient is a 25 y.o. G0P0000 presents for annual exam.   She has additional complaints today of problems with heavy menstrual cycles and desire to obtain pregnancy.   LMP: Patient's last menstrual period was 05/31/2019. Average Interval: Monthly Duration of flow: 5 days Heavy Menses: yes Intermenstrual Bleeding: no Dysmenorrhea: yes  She reports that she sometimes has heavy bleeding during her menstrual cycle.  She denies any passage of palm-sized clots during her menstrual cycle.  She denies any sensations of gushing or flooding of blood.  She reports that she does have to change a saturated pad or tampon more frequently than every hour.  She does not have accidents where she bleeds through her clothing.  She does have severe pain during her period that prevents her from attending work in school.  She describes the pain that occurs during her period as a sharp stabbing sensation that comes and goes every 3-minute minutes throughout her menstrual cycle.  Nothing helps with the pain including Motrin and naproxen.  She reports she has tried a wide variety of pain medications and nothing has relieved this pain in the past.  She reports that she was seeing [facility] and the next steps that were plan for her evaluation was to evaluate for endometriosis by laparoscopy.  She reports that she can no longer follow-up with [facility] because her insurance changed and she would like to have the procedure done here.  While being seen at [facility] she had a SIS which did not show a uterine septum.   The patient does perform self breast exams.  There is notable family history of breast or ovarian cancer in her family.  The patient reports that she does not exercise..  The patient reports current symptoms of depression.   PHQ-9: 19 GAD-7:  12  She reports that she feels more stressed than depressed.  She reports that most of her stressors are regarding finances.  She feels like she is used to everything turning out poorly and has a difficult time feeling positive.  She would like to not be stressed and little things tend upset her.  She reports that she finds that since the passing of her grandfather and grandmother life has been more challenging.  She has one remaining grandmother but feels that she is generally not supportive of her life pursuits.  Review of Systems: Review of Systems  Constitutional: Negative for chills, fever, malaise/fatigue and weight loss.  HENT: Negative for congestion, hearing loss and sinus pain.   Eyes: Negative for blurred vision and double vision.  Respiratory: Negative for cough, sputum production, shortness of breath and wheezing.   Cardiovascular: Negative for chest pain, palpitations, orthopnea and leg swelling.  Gastrointestinal: Negative for abdominal pain, constipation, diarrhea, nausea and vomiting.  Genitourinary: Negative for dysuria, flank pain, frequency, hematuria and urgency.  Musculoskeletal: Negative for back pain, falls and joint pain.  Skin: Negative for itching and rash.  Neurological: Negative for dizziness and headaches.  Psychiatric/Behavioral: Negative for depression, substance abuse and suicidal ideas. The patient is not nervous/anxious.     Past Medical History:  Past Medical History:  Diagnosis Date  . Dysmenorrhea   . Menorrhagia   . Obesity (BMI 30.0-34.9)     Past Surgical History:  Past Surgical History:  Procedure Laterality  Date  . nexplanon  10/25/2012   [facility]     Gynecologic History:  Patient's last menstrual period was 05/31/2019. Menarche: 13  History of fibroids, polyps, or ovarian cysts? : Denies   History of PCOS? denies Hstory of Endometriosis? denies History of abnormal pap smears? denies Have you had any sexually transmitted infections in the  past?  denies   She has not received HPV vaccination in the past. She declines vaccination today.  Last Pap: Results were: 2020 NIL    She identifies as a female. She is sexually active with men.   She denies dyspareunia. She denies postcoital bleeding.  She does not use contraception.    Obstetric History: G0P0000  Family History:  Family History  Problem Relation Age of Onset  . Colon cancer Maternal Grandmother   . Hypertension Maternal Grandfather   . Heart disease Maternal Grandfather   . Kidney failure Maternal Grandfather   . Hypertension Paternal Grandmother   . Hypertension Maternal Uncle   . Stroke Maternal Uncle   . Stroke Other   . Breast cancer Other        2 maternal great aunts with breast cancer (37s) and a maternal great aunt with colon cancer (65)  . Stomach cancer Other     Social History:  Social History   Socioeconomic History  . Marital status: Single    Spouse name: Not on file  . Number of children: 0  . Years of education: Not on file  . Highest education level: Not on file  Occupational History  . Occupation: Patient care assistant  Tobacco Use  . Smoking status: Never Smoker  . Smokeless tobacco: Never Used  Vaping Use  . Vaping Use: Never used  Substance and Sexual Activity  . Alcohol use: Yes  . Drug use: No  . Sexual activity: Yes    Partners: Male    Birth control/protection: None  Other Topics Concern  . Not on file  Social History Narrative  . Not on file   Social Determinants of Health   Financial Resource Strain: Not on file  Food Insecurity: Not on file  Transportation Needs: Not on file  Physical Activity: Not on file  Stress: Not on file  Social Connections: Not on file  Intimate Partner Violence: Not on file    Allergies:  No Known Allergies  Medications: Prior to Admission medications   Medication Sig Start Date End Date Taking? Authorizing Provider  Prenatal Vit-Fe Fumarate-FA (PREPLUS) 27-1 MG TABS  Take 1 tablet by mouth once daily 09/07/19  Yes Promise Weldin R, MD  chlorhexidine (PERIDEX) 0.12 % solution TAKE 10 CC SWISH AND EXPECTORATE TWICE DAILY Patient not taking: Reported on 06/29/2020 03/14/18   [provider]  medroxyPROGESTERone (DEPO-PROVERA) 150 MG/ML injection Inject 1 mL (150 mg total) into the muscle every 3 (three) months. Patient not taking: Reported on 06/29/2020 03/03/19   Nadara Mustard, MD  meloxicam (MOBIC) 7.5 MG tablet Take 1 tablet (7.5 mg total) by mouth 2 (two) times daily as needed for pain. Patient not taking: Reported on 06/29/2020 11/07/18   Nadara Mustard, MD  metFORMIN (GLUCOPHAGE) 500 MG tablet Take by mouth 2 (two) times daily with a meal. Patient not taking: Reported on 06/29/2020    [provider]  Prenatal Multivit-Min-Fe-FA (PRENATAL VITAMINS) 0.8 MG tablet Take 1 tablet by mouth daily. Patient not taking: Reported on 11/03/2018 03/13/18   Natale Milch, MD    Physical Exam Vitals: Blood pressure  112/70, height 5\' 3"  (1.6 m), weight 187 lb (84.8 kg), last menstrual period 05/31/2019.  Physical Exam Constitutional:      Appearance: She is well-developed.  Genitourinary:     Vagina and uterus normal.     There is no lesion on the right labia.     There is no lesion on the left labia.    No lesions in the vagina.      Right Adnexa: no mass present.    Left Adnexa: no mass present.    No cervical motion tenderness.  Breasts:     Right: No inverted nipple, mass, nipple discharge or skin change.     Left: No inverted nipple, mass, nipple discharge or skin change.    HENT:     Head: Normocephalic and atraumatic.  Eyes:     Extraocular Movements: EOM normal.  Neck:     Thyroid: No thyromegaly.  Cardiovascular:     Rate and Rhythm: Normal rate and regular rhythm.     Heart sounds: Normal heart sounds.  Pulmonary:     Effort: Pulmonary effort is normal.     Breath sounds: Normal breath sounds.  Abdominal:      General: Bowel sounds are normal. There is no distension.     Palpations: Abdomen is soft. There is no mass.  Musculoskeletal:     Cervical back: Neck supple.  Neurological:     Mental Status: She is alert and oriented to person, place, and time.  Skin:    General: Skin is warm and dry.  Psychiatric:        Mood and Affect: Mood and affect normal.        Behavior: Behavior normal.        Thought Content: Thought content normal.        Judgment: Judgment normal.  Vitals reviewed. Exam conducted with a chaperone present.      Female chaperone present for pelvic and breast  portions of the physical exam  Assessment: 25 y.o. G0P0000 routine annual exam  Plan: Problem List Items Addressed This Visit   None     1) STI screening was offered and accepted  2) ASCCP guidelines and rational discussed.  Patient opts for every 3 years screening interval  3) Contraception discussed. Patient desires pregnancy. Declines contraception.  4) Routine healthcare maintenance including cholesterol, diabetes screening discussed managed by PCP  5) Pelvic pain and dysmenorrhea- desires laparoscopy to evaluate for possible endometriosis  6) Referral to dermatology for acne management  30 MD, [facility], [facility] 06/29/2020 11:32 AM

## 2020-07-01 LAB — CERVICOVAGINAL ANCILLARY ONLY
Chlamydia: NEGATIVE
Comment: NEGATIVE
Comment: NEGATIVE
Comment: NORMAL
Neisseria Gonorrhea: NEGATIVE
Trichomonas: NEGATIVE

## 2020-07-07 ENCOUNTER — Telehealth: Payer: Self-pay

## 2020-07-07 NOTE — Telephone Encounter (Signed)
-----   Message from Natale Milch, MD sent at 06/29/2020 11:39 AM EST ----- Surgery Booking Request Patient Full Name:  Yvonne Herrera  MRN: [id number]  DOB: 16-Jul-1995  Surgeon: Natale Milch, MD  Requested Surgery Date and Time: up to patient Primary Diagnosis AND Code: Pelvic pain Secondary Diagnosis and Code:  Surgical Procedure: Diagnostic laparoscopy RNFA Requested?: No L&D Notification: No Admission Status: same day surgery Length of Surgery: 50 min Special Case Needs: No H&P: YES Phone Interview???:  Yes Interpreter: No Medical Clearance:  No Special Scheduling Instructions: No Any known health/anesthesia issues, diabetes, sleep apnea, latex allergy, defibrillator/pacemaker?: No Acuity: P4   (P1 highest, P2 delay may cause harm, P3 low, elective gyn, P4 lowest)

## 2020-07-07 NOTE — Telephone Encounter (Signed)
Called patient to schedule diagnostic laparoscopy w [facility]  DOS 3/10  H&P 3/3 @ 2:50   Covid testing 3/8 @ 8-10:30, [address], drive up and wear mask. Advised pt to quarantine until DOS.  Pre-admit phone call appointment to be requested - date and time will be included on H&P paper work. Also all appointments will be updated on pt MyChart. Explained that this appointment has a call window. Based on the time scheduled will indicate if the call will be received within a 4 hour window before 1:00 or after.  Advised that pt may also receive calls from the hospital pharmacy and pre-service center.  Confirmed pt has [employer] as primary insurance. No secondary insurance.

## 2020-07-20 NOTE — Telephone Encounter (Signed)
I placed orders for this- could we switch it over to a robot assisted laparoscopy?

## 2020-08-04 ENCOUNTER — Encounter: Payer: 59 | Admitting: Obstetrics and Gynecology

## 2020-08-05 ENCOUNTER — Inpatient Hospital Stay: Admission: RE | Admit: 2020-08-05 | Payer: Medicaid Other | Source: Ambulatory Visit

## 2020-08-09 ENCOUNTER — Other Ambulatory Visit: Payer: 59

## 2020-08-15 ENCOUNTER — Other Ambulatory Visit: Payer: Medicaid Other

## 2020-08-18 ENCOUNTER — Telehealth: Payer: Self-pay

## 2020-08-18 ENCOUNTER — Encounter: Payer: 59 | Admitting: Obstetrics and Gynecology

## 2020-08-18 NOTE — Telephone Encounter (Signed)
I called pt to let her know she missed her H&P with [facility]. She said the lady I talked to told me my pre-op was on 3/24 and "I took a half day then because my appt was at 2 something". I let her know that her surgery is on 3/24 and today was her pre-op. "I don't know who I talked to but she told me the 24th." I responded telling her that I would have been the person she talk with to schedule her surgery. I asked if she would like to reschedule her pre-op to which she quickly replied "I have to work. I will just have to call back to schedule."  I am not sure if she meant to schedule her pre-op or the whole surgery. I will leave her remaining appts scheduled as she may call back to reschedule her H&P.

## 2020-08-19 ENCOUNTER — Encounter: Payer: Self-pay | Admitting: Obstetrics and Gynecology

## 2020-08-19 ENCOUNTER — Inpatient Hospital Stay
Admission: RE | Admit: 2020-08-19 | Discharge: 2020-08-19 | Disposition: A | Discharge status: Home / Self Care | Payer: Medicaid Other | Source: Ambulatory Visit

## 2020-08-19 NOTE — Patient Instructions (Signed)
Your procedure is scheduled on:  Thursday, March 24 Report to the Registration Desk on the 1st floor of the [facility] To find out your arrival time, please call [phone] between 1PM - 3PM on: Wednesday, March 23  REMEMBER: Instructions that are not followed completely may result in serious medical risk, up to and including death; or upon the discretion of your surgeon and anesthesiologist your surgery may need to be rescheduled.  Do not eat food after midnight the night before surgery.  No gum chewing, lozengers or hard candies.  You may however, drink CLEAR liquids up to 2 hours before you are scheduled to arrive for your surgery. Do not drink anything within 2 hours of your scheduled arrival time.  Clear liquids include: - water  - apple juice without pulp - gatorade (not RED, PURPLE, OR BLUE) - black coffee or tea (Do NOT add milk or creamers to the coffee or tea) Do NOT drink anything that is not on this list.  In addition, your doctor has ordered for you to drink the provided  Ensure Pre-Surgery Clear Carbohydrate Drink  Drinking this carbohydrate drink up to two hours before surgery helps to reduce insulin resistance and improve patient outcomes. Please complete drinking 2 hours prior to scheduled arrival time.  DO NOT TAKE ANY MEDICATIONS THE MORNING OF SURGERY   One week prior to surgery: STARTING TODAY, MARCH 18 Stop Anti-inflammatories (NSAIDS) such as Advil, Aleve, Ibuprofen, Motrin, Naproxen, Naprosyn and Aspirin based products such as Excedrin, Goodys Powder, BC Powder. Stop ANY OVER THE COUNTER supplements until after surgery.  No Alcohol for 24 hours before or after surgery.  No Smoking including e-cigarettes for 24 hours prior to surgery.  No chewable tobacco products for at least 6 hours prior to surgery.  No nicotine patches on the day of surgery.  Do not use any "recreational" drugs for at least a week prior to your surgery.  Please be advised that the  combination of cocaine and anesthesia may have negative outcomes, up to and including death. If you test positive for cocaine, your surgery will be cancelled.  On the morning of surgery brush your teeth with toothpaste and water, you may rinse your mouth with mouthwash if you wish. Do not swallow any toothpaste or mouthwash.  Do not wear jewelry, make-up, hairpins, clips or nail polish.  Do not wear lotions, powders, or perfumes.   Do not shave body from the neck down 48 hours prior to surgery just in case you cut yourself which could leave a site for infection.  Also, freshly shaved skin may become irritated if using the CHG soap.  Contact lenses, hearing aids and dentures may not be worn into surgery.  Do not bring valuables to the hospital. [facility] is not responsible for any missing/lost belongings or valuables.   Use CHG Soap as directed on instruction sheet.  Bring your C-PAP to the hospital with you in case you may have to spend the night.   Notify your doctor if there is any change in your medical condition (cold, fever, infection).  Wear comfortable clothing (specific to your surgery type) to the hospital.  Plan for stool softeners for home use; pain medications have a tendency to cause constipation. You can also help prevent constipation by eating foods high in fiber such as fruits and vegetables and drinking plenty of fluids as your diet allows.  After surgery, you can help prevent lung complications by doing breathing exercises.  Take deep  breaths and cough every 1-2 hours. Your doctor may order a device called an Incentive Spirometer to help you take deep breaths. When coughing or sneezing, hold a pillow firmly against your incision with both hands. This is called "splinting." Doing this helps protect your incision. It also decreases belly discomfort.  If you are being discharged the day of surgery, you will not be allowed to drive home. You will need a responsible  adult (18 years or older) to drive you home and stay with you that night.   If you are taking public transportation, you will need to have a responsible adult (18 years or older) with you. Please confirm with your physician that it is acceptable to use public transportation.   Please call the Pre-admissions Testing Dept. at [phone] if you have any questions about these instructions.  Surgery Visitation Policy:  Patients undergoing a surgery or procedure may have one family member or support person with them as long as that person is not COVID-19 positive or experiencing its symptoms.  That person may remain in the waiting area during the procedure.

## 2020-08-19 NOTE — Progress Notes (Signed)
Attempted to call patient again about preop interview. Patient answered but hung up after introduction. Call to Dr. Arnette Felts office to inform them of the phone call attempts. Surgery Yvonne Herrera possibly be cancelled.

## 2020-08-19 NOTE — Pre-Procedure Instructions (Signed)
Call to patient to do preop interview. Patient answered and after introducing myself the phone went dead. Tried calling back x2. Left message on voicemail to return my call today to complete the preop interview with needed instructions for her upcoming surgery.

## 2020-08-23 ENCOUNTER — Other Ambulatory Visit: Payer: Medicaid Other

## 2020-08-23 NOTE — Telephone Encounter (Signed)
[facility] reached out to patient and rec'd NO response. She adv to go ahead and cancel scheduled surgery.

## 2020-08-25 ENCOUNTER — Ambulatory Visit
Admission: RE | Admit: 2020-08-25 | Payer: Medicaid Other | Source: Home / Self Care | Admitting: Obstetrics and Gynecology

## 2020-08-25 ENCOUNTER — Encounter: Admission: RE | Payer: Self-pay | Source: Home / Self Care

## 2020-08-25 SURGERY — LAPAROSCOPY, DIAGNOSTIC, ROBOT-ASSISTED
Anesthesia: Choice

## 2020-09-06 ENCOUNTER — Other Ambulatory Visit: Payer: Self-pay | Admitting: Obstetrics & Gynecology

## 2020-09-24 NOTE — Progress Notes (Signed)
   Patient ID: Yvonne Herrera, female   DOB: 06-09-1995, 25 y.o.   MRN: [id number]  Reason for Consult: Follow-up (hasnt had period sense 3 months before last appt)   Referred by No ref. provider found  Subjective:     HPI:  Yvonne Herrera is a 25 y.o. female . She reports painful and irregular periods.     Past Medical History:  Diagnosis Date  . Dysmenorrhea   . Menorrhagia   . Obesity (BMI 30.0-34.9)    Family History  Problem Relation Age of Onset  . Colon cancer Maternal Grandmother   . Hypertension Maternal Grandfather   . Heart disease Maternal Grandfather   . Kidney failure Maternal Grandfather   . Hypertension Paternal Grandmother   . Hypertension Maternal Uncle   . Stroke Maternal Uncle   . Stroke Other   . Breast cancer Other        2 maternal great aunts with breast cancer (74s) and a maternal great aunt with colon cancer (65)  . Stomach cancer Other    Past Surgical History:  Procedure Laterality Date  . nexplanon  10/25/2012   [facility]     Short Social History:  Social History   Tobacco Use  . Smoking status: Never Smoker  . Smokeless tobacco: Never Used  Substance Use Topics  . Alcohol use: Yes    No Known Allergies  Current Outpatient Medications  Medication Sig Dispense Refill  . BLACK ELDERBERRY PO Take 1 each by mouth in the morning and at bedtime.    . Multiple Vitamins-Minerals (MULTIVITAMIN WITH MINERALS) tablet Take 1 tablet by mouth daily.     No current facility-administered medications for this visit.    REVIEW OF SYSTEMS      Objective:  Objective   Vitals:   02/21/18 1610  BP: 124/82  Pulse: 95  Weight: 204 lb (92.5 kg)  Height: 5\' 3"  (1.6 m)   Body mass index is 36.14 kg/m.  Physical Exam  Assessment/Plan:     25 yo with irregular and painful  menstrual cycle Labs ordered Will return for a pelvic 30.  Naproxen BID for menstrual cycle pain.    [place] MD [facility], [facility] 09/24/2020 12:54 PM

## 2020-10-04 DIAGNOSIS — L708 Other acne: Secondary | ICD-10-CM | POA: Insufficient documentation

## 2020-10-06 ENCOUNTER — Ambulatory Visit: Payer: 59 | Admitting: Obstetrics and Gynecology

## 2020-10-12 ENCOUNTER — Telehealth: Payer: Self-pay

## 2020-10-12 NOTE — Telephone Encounter (Signed)
Pt aware.

## 2020-10-12 NOTE — Telephone Encounter (Signed)
Pt calling after hour nurse 10/11/20 9:12pm stating she had a questions for the nurse.  [phone] Pt states she is on depo; she gives herself the shot; gave the shot last month then realized the box had an expiration date of 4-?-21; the syringe had exp date of 2-?-22.  Pt wants to know what she should do; take another shot or what?  Adv I would send this to a provider as [facility] is not in the office today and get back with her.  In the meantime, it wouldn't hurt if she would call her pharmacist and see what they say.

## 2020-10-12 NOTE — Telephone Encounter (Signed)
I would not recommend taking another shot,  as there was likely significant working medicine within that expired injection, but it would be advised to use backup birth control until time for the next injection.  I am not sure her circumstances as to why she is self-administrating her shots, and if she would like [facility] to resume/start giving her these shots once every three mos, that is preferred.

## 2020-12-19 ENCOUNTER — Ambulatory Visit: Payer: Medicaid Other | Admitting: Dermatology

## 2021-01-30 ENCOUNTER — Ambulatory Visit (INDEPENDENT_AMBULATORY_CARE_PROVIDER_SITE_OTHER): Payer: 59 | Admitting: Certified Nurse Midwife

## 2021-01-30 ENCOUNTER — Other Ambulatory Visit: Payer: Self-pay

## 2021-01-30 ENCOUNTER — Encounter: Payer: Self-pay | Admitting: Certified Nurse Midwife

## 2021-01-30 VITALS — BP 121/76 | HR 81 | Ht 63.0 in | Wt 173.4 lb

## 2021-01-30 DIAGNOSIS — Z1159 Encounter for screening for other viral diseases: Secondary | ICD-10-CM

## 2021-01-30 DIAGNOSIS — Z3189 Encounter for other procreative management: Secondary | ICD-10-CM

## 2021-01-30 DIAGNOSIS — Z13220 Encounter for screening for lipoid disorders: Secondary | ICD-10-CM

## 2021-01-30 DIAGNOSIS — Z01419 Encounter for gynecological examination (general) (routine) without abnormal findings: Secondary | ICD-10-CM | POA: Diagnosis not present

## 2021-01-30 NOTE — Progress Notes (Signed)
GYNECOLOGY ANNUAL PREVENTATIVE CARE ENCOUNTER NOTE  History:     Yvonne Herrera is a 25 y.o. G0P0000 female here for a routine annual gynecologic exam.  Current complaints: Painful periods that has caluse her to miss school /work. They get so bad that it causes nausea and vomiting. She states they are regular and that she has tried birth control, lysteda, progesterone for in the past. She has taken tylenol and Advil that does not help. States she had an u/s at [facility] that was normal to r/o fibroids. She does not want to take medications that do not work.IS not wanting to take birth control because she wants to become pregnant. She is worried that she will not be able to conceive.  Denies abnormal vaginal bleeding, discharge, pelvic pain, problems with intercourse or other gynecologic concerns.     Social Relationship: female partner  Living: with her partner  Work:  Exercise:  Smoke/Alcohol/drug use: denies smoking and vaping, alcohol use. Use Marijuana daily   Gynecologic History No LMP recorded (lmp unknown). Contraception: none Last Pap: 11/03/2018. Results were: normal  Last mammogram: n/a .    Obstetric History OB History  Gravida Para Term Preterm AB Living  0 0 0 0 0 0  SAB IAB Ectopic Multiple Live Births  0 0 0 0 0    Past Medical History:  Diagnosis Date   Dysmenorrhea    Menorrhagia    Obesity (BMI 30.0-34.9)     Past Surgical History:  Procedure Laterality Date   nexplanon  10/25/2012   [facility]     Current Outpatient Medications on File Prior to Visit  Medication Sig Dispense Refill   Ascorbic Acid (VITAMIN C) 100 MG tablet Take 100 mg by mouth daily.     BLACK ELDERBERRY PO Take 1 each by mouth in the morning and at bedtime.     cholecalciferol (VITAMIN D3) 25 MCG (1000 UNIT) tablet Take 1,000 Units by mouth daily.     Prenatal Vit-Fe Fumarate-FA (MULTIVITAMIN-PRENATAL) 27-0.8 MG TABS tablet Take 1 tablet by mouth daily at 12 noon.     Multiple  Vitamins-Minerals (MULTIVITAMIN WITH MINERALS) tablet Take 1 tablet by mouth daily. (Patient not taking: Reported on 01/30/2021)     No current facility-administered medications on file prior to visit.    No Known Allergies  Social History:  reports that she has never smoked. She has never used smokeless tobacco. She reports current alcohol use. She reports that she does not use drugs.  Family History  Problem Relation Age of Onset   Colon cancer Maternal Grandmother    Hypertension Maternal Grandfather    Heart disease Maternal Grandfather    Kidney failure Maternal Grandfather    Hypertension Paternal Grandmother    Hypertension Maternal Uncle    Stroke Maternal Uncle    Stroke Other    Breast cancer Other        2 maternal great aunts with breast cancer (8s) and a maternal great aunt with colon cancer (65)   Stomach cancer Other     The following portions of the patient's history were reviewed and updated as appropriate: allergies, current medications, past family history, past medical history, past social history, past surgical history and problem list.  Review of Systems Pertinent items noted in HPI and remainder of comprehensive ROS otherwise negative.  Physical Exam:  BP 121/76   Pulse 81   Ht 5\' 3"  (1.6 m)   Wt 173 lb 6.4 oz (78.7 kg)  LMP  (LMP Unknown)   BMI 30.72 kg/m  CONSTITUTIONAL: Well-developed, well-nourished female in no acute distress.  HENT:  Normocephalic, atraumatic, External right and left ear normal. Oropharynx is clear and moist EYES: Conjunctivae and EOM are normal. Pupils are equal, round, and reactive to light. No scleral icterus.  NECK: Normal range of motion, supple, no masses.  Normal thyroid.  SKIN: Skin is warm and dry. No rash noted. Not diaphoretic. No erythema. No pallor. MUSCULOSKELETAL: Normal range of motion. No tenderness.  No cyanosis, clubbing, or edema.  2+ distal pulses. NEUROLOGIC: Alert and oriented to person, place, and time.  Normal reflexes, muscle tone coordination.  PSYCHIATRIC: Normal mood and affect. Normal behavior. Normal judgment and thought content. CARDIOVASCULAR: Normal heart rate noted, regular rhythm RESPIRATORY: Clear to auscultation bilaterally. Effort and breath sounds normal, no problems with respiration noted. BREASTS: Symmetric in size. No masses, tenderness, skin changes, nipple drainage, or lymphadenopathy bilaterally.  ABDOMEN: Soft, no distention noted.  No tenderness, rebound or guarding.  PELVIC: Normal appearing external genitalia and urethral meatus; normal appearing vaginal mucosa and cervix.  No abnormal discharge noted.  Pap smear not indicated.  Normal uterine size, no other palpable masses, no uterine or adnexal tenderness.  .   Assessment and Plan:    1. Well woman exam with routine gynecological exam  Pap: not due  Mammogram : n/a  Labs: lipid, Hep C Refills: none Referral: infertility  Routine preventative health maintenance measures emphasized. Please refer to After Visit Summary for other counseling recommendations.      Doreene Burke, CNM [facility],  [facility]

## 2021-01-31 LAB — LIPID PANEL
Chol/HDL Ratio: 3.1 ratio (ref 0.0–4.4)
Cholesterol, Total: 150 mg/dL (ref 100–199)
HDL: 49 mg/dL (ref >39)
LDL Chol Calc (NIH): 91 mg/dL (ref 0–99)
Triglycerides: 44 mg/dL (ref 0–149)
VLDL Cholesterol Cal: 10 mg/dL (ref 5–40)

## 2021-01-31 LAB — HEPATITIS C ANTIBODY: Hep C Virus Ab: <0.1 s/co ratio (ref 0.0–0.9)

## 2021-04-20 ENCOUNTER — Ambulatory Visit (INDEPENDENT_AMBULATORY_CARE_PROVIDER_SITE_OTHER): Payer: 59 | Admitting: Nurse Practitioner

## 2021-04-20 ENCOUNTER — Other Ambulatory Visit: Payer: Self-pay

## 2021-04-20 ENCOUNTER — Encounter: Payer: Self-pay | Admitting: Nurse Practitioner

## 2021-04-20 VITALS — BP 126/60 | HR 100 | Temp 98.2°F | Resp 16 | Ht 63.0 in | Wt 174.0 lb

## 2021-04-20 DIAGNOSIS — G8929 Other chronic pain: Secondary | ICD-10-CM

## 2021-04-20 DIAGNOSIS — M25532 Pain in left wrist: Secondary | ICD-10-CM | POA: Diagnosis not present

## 2021-04-20 DIAGNOSIS — Z7689 Persons encountering health services in other specified circumstances: Secondary | ICD-10-CM | POA: Diagnosis not present

## 2021-04-20 DIAGNOSIS — L700 Acne vulgaris: Secondary | ICD-10-CM

## 2021-04-20 DIAGNOSIS — H5203 Hypermetropia, bilateral: Secondary | ICD-10-CM | POA: Diagnosis not present

## 2021-04-20 MED ORDER — PANTOPRAZOLE SODIUM 40 MG PO TBEC: 40.0000 mg | DELAYED_RELEASE_TABLET | Freq: Every day | ORAL | 2 refills | Status: DC

## 2021-04-20 MED ORDER — MELOXICAM 7.5 MG PO TABS: 7.5000 mg | ORAL_TABLET | Freq: Every day | ORAL | 2 refills | Status: DC

## 2021-04-20 NOTE — Progress Notes (Signed)
[facility] [place], [place] [address]  Internal MEDICINE  Office Visit Note  Patient Name: Yvonne Herrera  [id number]  [id number]  Date of Service: 04/20/2021   Complaints/HPI Pt is here for establishment of PCP. Chief Complaint  Patient presents with   New Patient (Initial Visit)    Breaking out, bumps, acne, leaving scars, left hand loses feeling, pt will drop items if they weigh more than 1lb, doesn't have strong grip in left hand   Gastroesophageal Reflux   HPI Yvonne Herrera presents for a new patient visit to establish care. She is a well appearing 25 year old female. She has GERD, acne, and weak grip in her left hand which has bothered her with pain and numbness and tingling. She has been trying to do stretches, go to the gym and use a stress ball.  For GERD, she takes nexium and prilosec.  She has also been dealing with acne and OTC products have not helped. The acne is leaving scars. For her acne, she would like to be referred to a dermatologist.  She has also been having a problem with her eyesight and would like to be referred to an eye doctor. She wears glasses but has not had her eye sight checked in a while or her prescription renewed.     Current Medication: Outpatient Encounter Medications as of 04/20/2021  Medication Sig   Ascorbic Acid (VITAMIN C) 100 MG tablet Take 100 mg by mouth daily.   BLACK ELDERBERRY PO Take 1 each by mouth in the morning and at bedtime.   cholecalciferol (VITAMIN D3) 25 MCG (1000 UNIT) tablet Take 1,000 Units by mouth daily.   meloxicam (MOBIC) 7.5 MG tablet Take 1 tablet (7.5 mg total) by mouth daily.   Multiple Vitamins-Minerals (MULTIVITAMIN WITH MINERALS) tablet Take 1 tablet by mouth daily.   Prenatal Vit-Fe Fumarate-FA (MULTIVITAMIN-PRENATAL) 27-0.8 MG TABS tablet Take 1 tablet by mouth daily at 12 noon.   [DISCONTINUED] pantoprazole (PROTONIX) 40 MG tablet Take 1 tablet (40 mg total) by mouth daily.   No  facility-administered encounter medications on file as of 04/20/2021.    Surgical History: Past Surgical History:  Procedure Laterality Date   nexplanon  10/25/2012   [facility]    WRIST SURGERY Left     Medical History: Past Medical History:  Diagnosis Date   Dysmenorrhea    GERD (gastroesophageal reflux disease)    Menorrhagia    Obesity (BMI 30.0-34.9)     Family History: Family History  Problem Relation Age of Onset   GER disease Father    Breast cancer Maternal Aunt    Cancer Maternal Aunt    Cancer Maternal Aunt    Hypertension Maternal Uncle    Stroke Maternal Uncle    Colon cancer Maternal Grandmother    Hypertension Maternal Grandfather    Heart disease Maternal Grandfather    Kidney failure Maternal Grandfather    Hypertension Paternal Grandmother    Stroke Other    Breast cancer Other        2 maternal great aunts with breast cancer (44s) and a maternal great aunt with colon cancer (65)   Stomach cancer Other     Social History   Socioeconomic History   Marital status: Single    Spouse name: Not on file   Number of children: 0   Years of education: Not on file   Highest education level: Not on file  Occupational History   Occupation: Patient care assistant  Tobacco Use   Smoking status: Never   Smokeless tobacco: Never  Vaping Use   Vaping Use: Never used  Substance and Sexual Activity   Alcohol use: Not Currently   Drug use: Yes    Types: Marijuana    Comment: daily   Sexual activity: Yes    Partners: Male    Birth control/protection: None  Other Topics Concern   Not on file  Social History Narrative   Not on file   Social Determinants of Health   Financial Resource Strain: Not on file  Food Insecurity: Not on file  Transportation Needs: Not on file  Physical Activity: Not on file  Stress: Not on file  Social Connections: Not on file  Intimate Partner Violence: Not on file     Review of Systems  Constitutional:  Negative for  chills, fatigue and unexpected weight change.  HENT:  Negative for congestion, rhinorrhea, sneezing and sore throat.   Eyes:  Positive for visual disturbance. Negative for redness.  Respiratory:  Negative for cough, chest tightness, shortness of breath and wheezing.   Cardiovascular:  Negative for chest pain and palpitations.  Gastrointestinal:  Negative for abdominal pain, constipation, diarrhea, nausea and vomiting.       Acid reflux  Genitourinary:  Negative for dysuria and frequency.  Musculoskeletal:  Negative for arthralgias, back pain, joint swelling and neck pain.  Skin:  Positive for rash (acne).  Neurological: Negative.  Negative for tremors and numbness.  Hematological:  Negative for adenopathy. Does not bruise/bleed easily.  Psychiatric/Behavioral:  Negative for behavioral problems (Depression), sleep disturbance and suicidal ideas. The patient is not nervous/anxious.    Vital Signs: BP 126/60   Pulse 100   Temp 98.2 F (36.8 C)   Resp 16   Ht 5\' 3"  (1.6 m)   Wt 174 lb (78.9 kg)   SpO2 98%   BMI 30.82 kg/m    Physical Exam Vitals reviewed.  Constitutional:      General: She is not in acute distress.    Appearance: Normal appearance. She is obese. She is not ill-appearing.  HENT:     Head: Normocephalic and atraumatic.  Eyes:     Pupils: Pupils are equal, round, and reactive to light.  Cardiovascular:     Rate and Rhythm: Normal rate and regular rhythm.  Pulmonary:     Effort: Pulmonary effort is normal. No respiratory distress.  Neurological:     Mental Status: She is alert and oriented to person, place, and time.     Cranial Nerves: No cranial nerve deficit.     Coordination: Coordination normal.     Gait: Gait normal.  Psychiatric:        Mood and Affect: Mood normal.        Behavior: Behavior normal.      Assessment/Plan: 1. Hyperopia of both eyes Referral sent for ophthalmology - Ambulatory referral to Ophthalmology  2. Acne vulgaris Derm  consult ordered.  - Ambulatory referral to Dermatology  3. Chronic pain of left wrist Rule out fracture or skeletal abnormalities - DG Wrist Complete Left; Future  4. Encounter to establish care with new doctor Establishing care with new PCP, in need of referrals but needs PCP for referrals. Next office visit will be follow up for labs and then patient will have CPE at next available opening.    General Counseling: Namiyah verbalizes understanding of the findings of todays visit and agrees with plan of treatment. I have discussed any further diagnostic evaluation  that may be needed or ordered today. We also reviewed her medications today. she has been encouraged to call the office with any questions or concerns that should arise related to todays visit.    Counseling:  [place] Controlled Substance Database was reviewed by me.  Orders Placed This Encounter  Procedures   DG Wrist Complete Left   Ambulatory referral to Dermatology   Ambulatory referral to Ophthalmology    Meds ordered this encounter  Medications   meloxicam (MOBIC) 7.5 MG tablet    Sig: Take 1 tablet (7.5 mg total) by mouth daily.    Dispense:  30 tablet    Refill:  2   DISCONTD: pantoprazole (PROTONIX) 40 MG tablet    Sig: Take 1 tablet (40 mg total) by mouth daily.    Dispense:  30 tablet    Refill:  2    Return in about 1 month (around 05/20/2021) for F/U left wrist pain, Shareka Casale PCP and then also CPE at earliest available. .  Time spent:30 Minutes Time spent with patient included reviewing progress notes, labs, imaging studies, and discussing plan for follow up.   Controlled Substance Database was reviewed by me for overdose risk score (ORS)  This patient was seen by Jonetta Osgood, FNP-C in collaboration with Dr. Clayborn Bigness as a part of collaborative care agreement.    Maxwell Lemen R. Valetta Fuller, MSN, FNP-C Internal Medicine

## 2021-04-25 ENCOUNTER — Telehealth: Payer: Self-pay

## 2021-04-25 NOTE — Telephone Encounter (Signed)
Gave patient telephone # [phone] for [facility] who takes her insurance so she can call to schedule appointment. No referral required-Toni

## 2021-05-01 ENCOUNTER — Ambulatory Visit: Payer: Medicaid Other

## 2021-05-02 ENCOUNTER — Telehealth: Payer: Self-pay

## 2021-05-02 ENCOUNTER — Other Ambulatory Visit: Payer: Self-pay | Admitting: Nurse Practitioner

## 2021-05-02 MED ORDER — ONDANSETRON HCL 4 MG PO TABS: 4.0000 mg | ORAL_TABLET | Freq: Three times a day (TID) | ORAL | 0 refills | Status: DC | PRN

## 2021-05-02 NOTE — Telephone Encounter (Signed)
Pt advised we send nausea medication and make her appt for tomorrow

## 2021-05-03 ENCOUNTER — Ambulatory Visit
Admission: RE | Admit: 2021-05-03 | Discharge: 2021-05-03 | Disposition: A | Discharge status: Home / Self Care | Payer: 59 | Source: Ambulatory Visit | Attending: Nurse Practitioner | Admitting: Nurse Practitioner

## 2021-05-03 ENCOUNTER — Other Ambulatory Visit: Payer: Self-pay

## 2021-05-03 ENCOUNTER — Encounter: Payer: Self-pay | Admitting: Nurse Practitioner

## 2021-05-03 ENCOUNTER — Ambulatory Visit (INDEPENDENT_AMBULATORY_CARE_PROVIDER_SITE_OTHER): Payer: 59 | Admitting: Nurse Practitioner

## 2021-05-03 ENCOUNTER — Ambulatory Visit
Admission: RE | Admit: 2021-05-03 | Discharge: 2021-05-03 | Disposition: A | Discharge status: Home / Self Care | Payer: 59 | Attending: Nurse Practitioner | Admitting: Nurse Practitioner

## 2021-05-03 VITALS — BP 114/70 | HR 84 | Temp 98.4°F | Resp 16 | Ht 63.0 in | Wt 173.8 lb

## 2021-05-03 DIAGNOSIS — M25532 Pain in left wrist: Secondary | ICD-10-CM | POA: Insufficient documentation

## 2021-05-03 DIAGNOSIS — G8929 Other chronic pain: Secondary | ICD-10-CM

## 2021-05-03 DIAGNOSIS — K219 Gastro-esophageal reflux disease without esophagitis: Secondary | ICD-10-CM

## 2021-05-03 DIAGNOSIS — R112 Nausea with vomiting, unspecified: Secondary | ICD-10-CM | POA: Insufficient documentation

## 2021-05-03 LAB — POCT URINE PREGNANCY: Preg Test, Ur: NEGATIVE

## 2021-05-03 MED ORDER — PANTOPRAZOLE SODIUM 40 MG PO TBEC: 40.0000 mg | DELAYED_RELEASE_TABLET | Freq: Two times a day (BID) | ORAL | 2 refills | Status: DC

## 2021-05-03 NOTE — Progress Notes (Signed)
[facility] [address] [place], [place] [address]  Internal MEDICINE  Office Visit Note  Patient Name: Yvonne Herrera  [id number]  [id number]  Date of Service: 05/03/2021  Chief Complaint  Patient presents with   Acute Visit    Nausea and vomiting, noticed a month ago, per pt no chance of pregnancy      HPI Yvonne Herrera presents for an acute sick visit for vomiting that started approximately 1 month ago.  Patient is not pregnant and there is no chance of pregnancy per patient report.  She reports that nausea occurs in the morning.  It is initially just nausea but then she does often start vomiting.  The emesis is food particles and mucus.  She has had a decreased appetite and has lost 1 pound since previous office visit.  She denies any abdominal pain, diarrhea, cough patient.  She reports having stomach cramping and early satiety.    Current Medication:  Outpatient Encounter Medications as of 05/03/2021  Medication Sig   Ascorbic Acid (VITAMIN C) 100 MG tablet Take 100 mg by mouth daily.   BLACK ELDERBERRY PO Take 1 each by mouth in the morning and at bedtime.   cholecalciferol (VITAMIN D3) 25 MCG (1000 UNIT) tablet Take 1,000 Units by mouth daily.   meloxicam (MOBIC) 7.5 MG tablet Take 1 tablet (7.5 mg total) by mouth daily.   Multiple Vitamins-Minerals (MULTIVITAMIN WITH MINERALS) tablet Take 1 tablet by mouth daily.   Prenatal Vit-Fe Fumarate-FA (MULTIVITAMIN-PRENATAL) 27-0.8 MG TABS tablet Take 1 tablet by mouth daily at 12 noon.   [DISCONTINUED] ondansetron (ZOFRAN) 4 MG tablet Take 1 tablet (4 mg total) by mouth every 8 (eight) hours as needed for nausea or vomiting.   [DISCONTINUED] pantoprazole (PROTONIX) 40 MG tablet Take 1 tablet (40 mg total) by mouth daily.   pantoprazole (PROTONIX) 40 MG tablet Take 1 tablet (40 mg total) by mouth 2 (two) times daily before a meal.   No facility-administered encounter medications on file as of 05/03/2021.      Medical  History: Past Medical History:  Diagnosis Date   Dysmenorrhea    GERD (gastroesophageal reflux disease)    Menorrhagia    Obesity (BMI 30.0-34.9)      Vital Signs: BP 114/70   Pulse 84   Temp 98.4 F (36.9 C)   Resp 16   Ht 5\' 3"  (1.6 m)   Wt 173 lb 12.8 oz (78.8 kg)   LMP 04/24/2021   SpO2 98%   BMI 30.79 kg/m    Review of Systems  Constitutional:  Negative for chills, fatigue and unexpected weight change.  HENT:  Negative for congestion, rhinorrhea, sneezing and sore throat.   Eyes:  Negative for redness.  Respiratory:  Negative for cough, chest tightness and shortness of breath.   Cardiovascular:  Negative for chest pain and palpitations.  Gastrointestinal:  Positive for nausea and vomiting. Negative for abdominal distention, abdominal pain, constipation and diarrhea.  Genitourinary:  Negative for dysuria and frequency.  Musculoskeletal:  Negative for arthralgias, back pain, joint swelling and neck pain.  Skin:  Negative for rash.  Neurological: Negative.  Negative for tremors and numbness.  Hematological:  Negative for adenopathy. Does not bruise/bleed easily.  Psychiatric/Behavioral:  Negative for behavioral problems (Depression), sleep disturbance and suicidal ideas. The patient is not nervous/anxious.    Physical Exam Constitutional:      General: She is not in acute distress.    Appearance: Normal appearance. She is obese. She is  not ill-appearing.  HENT:     Head: Normocephalic and atraumatic.  Cardiovascular:     Rate and Rhythm: Normal rate and regular rhythm.  Pulmonary:     Effort: Pulmonary effort is normal. No respiratory distress.  Abdominal:     General: There is no distension.     Palpations: There is no mass.     Tenderness: There is no abdominal tenderness.     Hernia: No hernia is present.  Neurological:     Mental Status: She is alert and oriented to person, place, and time.     Cranial Nerves: No cranial nerve deficit.     Coordination:  Coordination normal.     Gait: Gait normal.  Psychiatric:        Mood and Affect: Mood normal.        Behavior: Behavior normal.      Assessment/Plan: 1. Nausea and vomiting, unspecified vomiting type Urine pregnancy test done to rule out pregnancy as cause of nausea and vomiting.  Abdominal x-ray ordered to rule out ileus or small bowel obstruction. - POCT urine pregnancy - DG Abd 2 Views; Future  2. Gastroesophageal reflux disease without esophagitis Pantoprazole prescribed for gastroesophageal reflux. - pantoprazole (PROTONIX) 40 MG tablet; Take 1 tablet (40 mg total) by mouth 2 (two) times daily before a meal.  Dispense: 60 tablet; Refill: 2   General Counseling: Yvonne Herrera verbalizes understanding of the findings of todays visit and agrees with plan of treatment. I have discussed any further diagnostic evaluation that may be needed or ordered today. We also reviewed her medications today. she has been encouraged to call the office with any questions or concerns that should arise related to todays visit.    Counseling:    Orders Placed This Encounter  Procedures   DG Abd 2 Views   POCT urine pregnancy    Meds ordered this encounter  Medications   pantoprazole (PROTONIX) 40 MG tablet    Sig: Take 1 tablet (40 mg total) by mouth 2 (two) times daily before a meal.    Dispense:  60 tablet    Refill:  2    Return if symptoms worsen or fail to improve.  [place] Controlled Substance Database was reviewed by me for overdose risk score (ORS)  Time spent:30 Minutes Time spent with patient included reviewing progress notes, labs, imaging studies, and discussing plan for follow up.   This patient was seen by Sallyanne Kuster, FNP-C in collaboration with Dr. Beverely Risen as a part of collaborative care agreement.  Basheer Molchan R. Tedd Sias, MSN, FNP-C Internal Medicine

## 2021-05-11 NOTE — Progress Notes (Signed)
Abdominal xray is normal, no acute abnormalities noted, no signs of bowel obstruction or ileus.

## 2021-05-11 NOTE — Progress Notes (Signed)
Xray is negative for any acute injury or fracture

## 2021-05-11 NOTE — Progress Notes (Signed)
LMOM that xray was neg.

## 2021-05-18 ENCOUNTER — Ambulatory Visit: Payer: 59 | Admitting: Nurse Practitioner

## 2021-05-19 NOTE — Progress Notes (Signed)
Sent pt mychart message that both xrays are normal

## 2021-05-19 NOTE — Progress Notes (Signed)
LMOM regarding xrays being neg or normal

## 2021-05-21 ENCOUNTER — Telehealth: Payer: Self-pay

## 2021-05-21 ENCOUNTER — Encounter: Payer: Self-pay | Admitting: Nurse Practitioner

## 2021-05-21 DIAGNOSIS — K219 Gastro-esophageal reflux disease without esophagitis: Secondary | ICD-10-CM | POA: Insufficient documentation

## 2021-05-21 NOTE — Telephone Encounter (Signed)
PA sent for PANTOPRAZOLE 40 mg 05/21/21 at 620pm but came back stating patient not a member.  Will contact pt to verify insurance

## 2021-05-22 ENCOUNTER — Encounter: Payer: Self-pay | Admitting: Nurse Practitioner

## 2021-05-22 ENCOUNTER — Other Ambulatory Visit: Payer: Self-pay

## 2021-05-22 ENCOUNTER — Ambulatory Visit (INDEPENDENT_AMBULATORY_CARE_PROVIDER_SITE_OTHER): Payer: 59 | Admitting: Nurse Practitioner

## 2021-05-22 VITALS — BP 135/75 | HR 94 | Temp 98.3°F | Resp 16 | Ht 63.0 in | Wt 167.2 lb

## 2021-05-22 DIAGNOSIS — J0110 Acute frontal sinusitis, unspecified: Secondary | ICD-10-CM

## 2021-05-22 DIAGNOSIS — K219 Gastro-esophageal reflux disease without esophagitis: Secondary | ICD-10-CM | POA: Diagnosis not present

## 2021-05-22 DIAGNOSIS — R112 Nausea with vomiting, unspecified: Secondary | ICD-10-CM | POA: Diagnosis not present

## 2021-05-22 DIAGNOSIS — H6503 Acute serous otitis media, bilateral: Secondary | ICD-10-CM | POA: Diagnosis not present

## 2021-05-22 MED ORDER — AMOXICILLIN-POT CLAVULANATE 875-125 MG PO TABS: 1.0000 | ORAL_TABLET | Freq: Two times a day (BID) | ORAL | 0 refills | Status: DC

## 2021-05-22 MED ORDER — ONDANSETRON HCL 4 MG PO TABS: 4.0000 mg | ORAL_TABLET | Freq: Three times a day (TID) | ORAL | 0 refills | Status: DC | PRN

## 2021-05-22 NOTE — Progress Notes (Signed)
[facility] [address] [place], [place] [address]  Internal MEDICINE  Office Visit Note  Patient Name: Yvonne Herrera  [id number]  [id number]  Date of Service: 05/22/2021  Chief Complaint  Patient presents with   Acute Visit    Possible ear infect, feels pressure on one side then will switch, they were itchy but now it feels like its draining mostly through her throat, feels like there is water in them    Sinusitis     HPI Yvonne Herrera presents for an acute sick visit for sinusitis.  Patient reports possible ear infection, feels pressure on one side and then will switch, ears feel itchy and now it feels like there is drainage down her throat and water in her ears.  She also reports nasal congestion, sinus pain and pressure, sore throat.  She denies fever, chills, fatigue, body aches, shortness of breath, wheezing, and chest tightness. Her nausea continues to be a problem and she is interested in a referral to gastroenterology.   Current Medication:  Outpatient Encounter Medications as of 05/22/2021  Medication Sig   amoxicillin-clavulanate (AUGMENTIN) 875-125 MG tablet Take 1 tablet by mouth 2 (two) times daily. Take with food   Ascorbic Acid (VITAMIN C) 100 MG tablet Take 100 mg by mouth daily.   BLACK ELDERBERRY PO Take 1 each by mouth in the morning and at bedtime.   cholecalciferol (VITAMIN D3) 25 MCG (1000 UNIT) tablet Take 1,000 Units by mouth daily.   meloxicam (MOBIC) 7.5 MG tablet Take 1 tablet (7.5 mg total) by mouth daily.   Multiple Vitamins-Minerals (MULTIVITAMIN WITH MINERALS) tablet Take 1 tablet by mouth daily.   pantoprazole (PROTONIX) 40 MG tablet Take 1 tablet (40 mg total) by mouth 2 (two) times daily before a meal.   Prenatal Vit-Fe Fumarate-FA (MULTIVITAMIN-PRENATAL) 27-0.8 MG TABS tablet Take 1 tablet by mouth daily at 12 noon.   [DISCONTINUED] ondansetron (ZOFRAN) 4 MG tablet Take 1 tablet (4 mg total) by mouth every 8 (eight) hours as needed for  nausea or vomiting.   ondansetron (ZOFRAN) 4 MG tablet Take 1 tablet (4 mg total) by mouth every 8 (eight) hours as needed for nausea or vomiting.   No facility-administered encounter medications on file as of 05/22/2021.      Medical History: Past Medical History:  Diagnosis Date   Dysmenorrhea    GERD (gastroesophageal reflux disease)    Menorrhagia    Obesity (BMI 30.0-34.9)      Vital Signs: BP 135/75    Pulse 94    Temp 98.3 F (36.8 C)    Resp 16    Ht 5\' 3"  (1.6 m)    Wt 167 lb 3.2 oz (75.8 kg)    LMP 04/24/2021    SpO2 100%    BMI 29.62 kg/m    Review of Systems  Constitutional:  Negative for chills, diaphoresis, fatigue, fever and unexpected weight change.  HENT:  Positive for congestion, rhinorrhea, sinus pressure, sinus pain and sore throat. Negative for sneezing and trouble swallowing.   Eyes:  Negative for redness.  Respiratory:  Positive for cough. Negative for chest tightness, shortness of breath and wheezing.   Cardiovascular: Negative.  Negative for chest pain and palpitations.  Gastrointestinal:  Positive for nausea and vomiting. Negative for abdominal distention, abdominal pain, constipation and diarrhea.  Genitourinary:  Negative for dysuria and frequency.  Musculoskeletal:  Negative for arthralgias, back pain, joint swelling, myalgias and neck pain.  Skin:  Negative for rash.  Neurological:  Positive for headaches. Negative for dizziness, tremors, light-headedness and numbness.  Hematological:  Negative for adenopathy. Does not bruise/bleed easily.  Psychiatric/Behavioral:  Negative for behavioral problems (Depression), sleep disturbance and suicidal ideas. The patient is not nervous/anxious.    Physical Exam Vitals reviewed.  Constitutional:      General: She is not in acute distress.    Appearance: Normal appearance. She is obese. She is not ill-appearing.  HENT:     Head: Normocephalic and atraumatic.     Right Ear: Ear canal and external ear  normal. Tenderness present. Tympanic membrane is erythematous and bulging.     Left Ear: Ear canal and external ear normal. Tenderness present. Tympanic membrane is erythematous and bulging.     Nose: Congestion and rhinorrhea present.     Mouth/Throat:     Mouth: Mucous membranes are moist.     Pharynx: Posterior oropharyngeal erythema present. No oropharyngeal exudate.  Eyes:     Pupils: Pupils are equal, round, and reactive to light.  Cardiovascular:     Rate and Rhythm: Normal rate and regular rhythm.     Heart sounds: Normal heart sounds. No murmur heard. Pulmonary:     Effort: Pulmonary effort is normal. No respiratory distress.     Breath sounds: Normal breath sounds. No wheezing or rhonchi.  Abdominal:     General: Bowel sounds are normal.     Palpations: Abdomen is soft.  Lymphadenopathy:     Cervical: No cervical adenopathy.  Neurological:     Mental Status: She is alert and oriented to person, place, and time.     Cranial Nerves: No cranial nerve deficit.     Gait: Gait normal.  Psychiatric:        Mood and Affect: Mood normal.        Behavior: Behavior normal.      Assessment/Plan: 1. Acute non-recurrent frontal sinusitis Empiric antibiotic treatment prescribed - amoxicillin-clavulanate (AUGMENTIN) 875-125 MG tablet; Take 1 tablet by mouth 2 (two) times daily. Take with food  Dispense: 14 tablet; Refill: 0  2. Non-recurrent acute serous otitis media of both ears Empiric antibiotic treatment prescribed - amoxicillin-clavulanate (AUGMENTIN) 875-125 MG tablet; Take 1 tablet by mouth 2 (two) times daily. Take with food  Dispense: 14 tablet; Refill: 0  3. Nausea and vomiting, unspecified vomiting type Zofran sent to pharmacy, referred to gastroenterology - Ambulatory referral to Gastroenterology - ondansetron (ZOFRAN) 4 MG tablet; Take 1 tablet (4 mg total) by mouth every 8 (eight) hours as needed for nausea or vomiting.  Dispense: 20 tablet; Refill: 0  4.  Gastroesophageal reflux disease without esophagitis Referred to gastroenterology - Ambulatory referral to Gastroenterology   General Counseling: [name] verbalizes understanding of the findings of todays visit and agrees with plan of treatment. I have discussed any further diagnostic evaluation that may be needed or ordered today. We also reviewed her medications today. she has been encouraged to call the office with any questions or concerns that should arise related to todays visit.    Counseling:    Orders Placed This Encounter  Procedures   Ambulatory referral to Gastroenterology    Meds ordered this encounter  Medications   amoxicillin-clavulanate (AUGMENTIN) 875-125 MG tablet    Sig: Take 1 tablet by mouth 2 (two) times daily. Take with food    Dispense:  14 tablet    Refill:  0   ondansetron (ZOFRAN) 4 MG tablet    Sig: Take 1 tablet (4 mg total) by mouth every 8 (  eight) hours as needed for nausea or vomiting.    Dispense:  20 tablet    Refill:  0    Return if symptoms worsen or fail to improve, for may cancel appt on 12/23.  [place] Controlled Substance Database was reviewed by me for overdose risk score (ORS)  Time spent:30 Minutes Time spent with patient included reviewing progress notes, labs, imaging studies, and discussing plan for follow up.   This patient was seen by Sallyanne Kuster, FNP-C in collaboration with Dr. Beverely Risen as a part of collaborative care agreement.  Maddalynn Barnard R. Tedd Sias, MSN, FNP-C Internal Medicine

## 2021-05-23 ENCOUNTER — Other Ambulatory Visit: Payer: Self-pay

## 2021-05-26 ENCOUNTER — Ambulatory Visit: Payer: 59 | Admitting: Nurse Practitioner

## 2021-06-20 ENCOUNTER — Telehealth: Payer: Self-pay

## 2021-06-20 NOTE — Telephone Encounter (Signed)
Left vm and sent mychart message to confirm 06/22/21 appointment-Toni

## 2021-06-22 ENCOUNTER — Encounter: Payer: 59 | Admitting: Nurse Practitioner

## 2021-07-13 ENCOUNTER — Ambulatory Visit (INDEPENDENT_AMBULATORY_CARE_PROVIDER_SITE_OTHER): Payer: 59 | Admitting: Nurse Practitioner

## 2021-07-13 ENCOUNTER — Other Ambulatory Visit: Payer: Self-pay

## 2021-07-13 ENCOUNTER — Encounter: Payer: Self-pay | Admitting: Nurse Practitioner

## 2021-07-13 VITALS — BP 116/78 | HR 87 | Temp 98.6°F | Resp 16 | Ht 63.0 in | Wt 167.0 lb

## 2021-07-13 DIAGNOSIS — R300 Dysuria: Secondary | ICD-10-CM

## 2021-07-13 DIAGNOSIS — M25532 Pain in left wrist: Secondary | ICD-10-CM | POA: Diagnosis not present

## 2021-07-13 DIAGNOSIS — Z0001 Encounter for general adult medical examination with abnormal findings: Secondary | ICD-10-CM | POA: Diagnosis not present

## 2021-07-13 DIAGNOSIS — G8929 Other chronic pain: Secondary | ICD-10-CM

## 2021-07-13 DIAGNOSIS — R112 Nausea with vomiting, unspecified: Secondary | ICD-10-CM | POA: Diagnosis not present

## 2021-07-13 DIAGNOSIS — K219 Gastro-esophageal reflux disease without esophagitis: Secondary | ICD-10-CM | POA: Diagnosis not present

## 2021-07-13 DIAGNOSIS — N946 Dysmenorrhea, unspecified: Secondary | ICD-10-CM | POA: Diagnosis not present

## 2021-07-13 MED ORDER — ONDANSETRON HCL 4 MG PO TABS: 4.0000 mg | ORAL_TABLET | Freq: Three times a day (TID) | ORAL | 0 refills | Status: DC | PRN

## 2021-07-13 MED ORDER — TRAMADOL HCL 50 MG PO TABS: 50.0000 mg | ORAL_TABLET | Freq: Four times a day (QID) | ORAL | 0 refills | Status: AC | PRN

## 2021-07-13 MED ORDER — SIMETHICONE 80 MG PO CHEW: 80.0000 mg | CHEWABLE_TABLET | Freq: Four times a day (QID) | ORAL | 0 refills | Status: DC | PRN

## 2021-07-13 MED ORDER — LANSOPRAZOLE 30 MG PO CPDR: 30.0000 mg | DELAYED_RELEASE_CAPSULE | Freq: Two times a day (BID) | ORAL | 2 refills | Status: DC

## 2021-07-13 NOTE — Progress Notes (Cosign Needed)
[facility] [place], [place] [address]  Internal MEDICINE  Office Visit Note  Patient Name: Yvonne Herrera  [id number]  IN:[id number]  Date of Service: 07/13/2021  Chief Complaint  Patient presents with   Annual Exam   Gastroesophageal Reflux    HPI Dalaya presents for an annual well visit and physical exam.  She is a well-appearing 26 year old female with a history of GERD and no other significant medical problems.  Patient does still have severe menstrual cramps with her cycle.  Today she is worried that she could be pregnant and wants a pregnancy test which was negative.  Patient's last menstrual period was 1 month ago.  She is feeling nauseous and sick mostly at night and is having heartburn.  She also has been having chronic pain of the left wrist and wants to see a specialist for this as well.    Current Medication: Outpatient Encounter Medications as of 07/13/2021  Medication Sig   Ascorbic Acid (VITAMIN C) 100 MG tablet Take 100 mg by mouth daily.   BLACK ELDERBERRY PO Take 1 each by mouth in the morning and at bedtime.   cholecalciferol (VITAMIN D3) 25 MCG (1000 UNIT) tablet Take 1,000 Units by mouth daily.   lansoprazole (PREVACID) 30 MG capsule Take 1 capsule (30 mg total) by mouth 2 (two) times daily before a meal.   Multiple Vitamins-Minerals (MULTIVITAMIN WITH MINERALS) tablet Take 1 tablet by mouth daily.   Prenatal Vit-Fe Fumarate-FA (MULTIVITAMIN-PRENATAL) 27-0.8 MG TABS tablet Take 1 tablet by mouth daily at 12 noon.   [EXPIRED] traMADol (ULTRAM) 50 MG tablet Take 1 tablet (50 mg total) by mouth every 6 (six) hours as needed for up to 5 days for severe pain.   [DISCONTINUED] meloxicam (MOBIC) 7.5 MG tablet Take 1 tablet (7.5 mg total) by mouth daily.   [DISCONTINUED] ondansetron (ZOFRAN) 4 MG tablet Take 1 tablet (4 mg total) by mouth every 8 (eight) hours as needed for nausea or vomiting.   [DISCONTINUED] pantoprazole (PROTONIX) 40 MG tablet  Take 1 tablet (40 mg total) by mouth 2 (two) times daily before a meal.   [DISCONTINUED] simethicone (MYLICON) 80 MG chewable tablet Chew 1 tablet (80 mg total) by mouth every 6 (six) hours as needed for flatulence.   [DISCONTINUED] amoxicillin-clavulanate (AUGMENTIN) 875-125 MG tablet Take 1 tablet by mouth 2 (two) times daily. Take with food (Patient not taking: Reported on 07/13/2021)   [DISCONTINUED] ondansetron (ZOFRAN) 4 MG tablet Take 1 tablet (4 mg total) by mouth every 8 (eight) hours as needed for nausea or vomiting.   No facility-administered encounter medications on file as of 07/13/2021.    Surgical History: Past Surgical History:  Procedure Laterality Date   nexplanon  10/25/2012   [facility]    WRIST SURGERY Left     Medical History: Past Medical History:  Diagnosis Date   Dysmenorrhea    GERD (gastroesophageal reflux disease)    Menorrhagia    Obesity (BMI 30.0-34.9)     Family History: Family History  Problem Relation Age of Onset   GER disease Father    Breast cancer Maternal Aunt    Cancer Maternal Aunt    Cancer Maternal Aunt    Hypertension Maternal Uncle    Stroke Maternal Uncle    Colon cancer Maternal Grandmother    Hypertension Maternal Grandfather    Heart disease Maternal Grandfather    Kidney failure Maternal Grandfather    Hypertension Paternal Grandmother    Stroke Other  Breast cancer Other        2 maternal great aunts with breast cancer (38s) and a maternal great aunt with colon cancer (59)   Stomach cancer Other     Social History   Socioeconomic History   Marital status: Single    Spouse name: Not on file   Number of children: 0   Years of education: Not on file   Highest education level: Not on file  Occupational History   Occupation: Patient care assistant  Tobacco Use   Smoking status: Never   Smokeless tobacco: Never  Vaping Use   Vaping Use: Never used  Substance and Sexual Activity   Alcohol use: Not Currently   Drug use:  Yes    Types: Marijuana    Comment: daily   Sexual activity: Yes    Partners: Male    Birth control/protection: None  Other Topics Concern   Not on file  Social History Narrative   Not on file   Social Determinants of Health   Financial Resource Strain: Not on file  Food Insecurity: Not on file  Transportation Needs: Not on file  Physical Activity: Not on file  Stress: Not on file  Social Connections: Not on file  Intimate Partner Violence: Not on file      Review of Systems  Constitutional:  Negative for activity change, appetite change, chills, fatigue, fever and unexpected weight change.  HENT: Negative.  Negative for congestion, ear pain, rhinorrhea, sore throat and trouble swallowing.   Eyes: Negative.   Respiratory: Negative.  Negative for cough, chest tightness, shortness of breath and wheezing.   Cardiovascular: Negative.  Negative for chest pain.  Gastrointestinal: Negative.  Negative for abdominal pain, blood in stool, constipation, diarrhea, nausea and vomiting.  Endocrine: Negative.   Genitourinary: Negative.  Negative for difficulty urinating, dysuria, frequency, hematuria and urgency.  Musculoskeletal: Negative.  Negative for arthralgias, back pain, joint swelling, myalgias and neck pain.  Skin: Negative.  Negative for rash and wound.  Allergic/Immunologic: Negative.  Negative for immunocompromised state.  Neurological: Negative.  Negative for dizziness, seizures, numbness and headaches.  Hematological: Negative.   Psychiatric/Behavioral: Negative.  Negative for behavioral problems, self-injury and suicidal ideas. The patient is not nervous/anxious.    Vital Signs: BP 116/78    Pulse 87    Temp 98.6 F (37 C)    Resp 16    Ht 5\' 3"  (1.6 m)    Wt 167 lb (75.8 kg)    SpO2 98%    BMI 29.58 kg/m    Physical Exam Vitals reviewed.  Constitutional:      General: She is awake. She is not in acute distress.    Appearance: Normal appearance. She is  well-developed, well-groomed and normal weight. She is not ill-appearing or diaphoretic.  HENT:     Head: Normocephalic and atraumatic.     Right Ear: Tympanic membrane, ear canal and external ear normal.     Left Ear: Tympanic membrane, ear canal and external ear normal.     Nose: Nose normal. No congestion or rhinorrhea.     Mouth/Throat:     Lips: Pink.     Mouth: Mucous membranes are moist.     Pharynx: Oropharynx is clear. Uvula midline. No oropharyngeal exudate or posterior oropharyngeal erythema.  Eyes:     General: Lids are normal. Vision grossly intact. Gaze aligned appropriately. No scleral icterus.       Right eye: No discharge.  Left eye: No discharge.     Extraocular Movements: Extraocular movements intact.     Conjunctiva/sclera: Conjunctivae normal.     Pupils: Pupils are equal, round, and reactive to light.     Funduscopic exam:    Right eye: Red reflex present.        Left eye: Red reflex present. Neck:     Thyroid: No thyromegaly.     Vascular: No JVD.     Trachea: Trachea and phonation normal. No tracheal deviation.  Cardiovascular:     Rate and Rhythm: Normal rate and regular rhythm.     Pulses: Normal pulses.     Heart sounds: Normal heart sounds, S1 normal and S2 normal. No murmur heard.   No friction rub. No gallop.  Pulmonary:     Effort: Pulmonary effort is normal. No accessory muscle usage or respiratory distress.     Breath sounds: Normal breath sounds and air entry. No stridor. No wheezing or rales.  Chest:     Chest wall: No tenderness.  Abdominal:     General: Bowel sounds are normal. There is no distension.     Palpations: Abdomen is soft. There is no shifting dullness, fluid wave, mass or pulsatile mass.     Tenderness: There is no abdominal tenderness. There is no guarding or rebound.  Musculoskeletal:        General: No tenderness or deformity. Normal range of motion.     Cervical back: Normal range of motion and neck supple.     Right  lower leg: No edema.     Left lower leg: No edema.  Lymphadenopathy:     Cervical: No cervical adenopathy.  Skin:    General: Skin is warm and dry.     Capillary Refill: Capillary refill takes less than 2 seconds.     Coloration: Skin is not pale.     Findings: No erythema or rash.  Neurological:     Mental Status: She is alert and oriented to person, place, and time.     Cranial Nerves: No cranial nerve deficit.     Motor: No abnormal muscle tone.     Coordination: Coordination normal.     Deep Tendon Reflexes: Reflexes are normal and symmetric.  Psychiatric:        Mood and Affect: Mood and affect normal.        Behavior: Behavior normal. Behavior is cooperative.        Thought Content: Thought content normal.        Judgment: Judgment normal.       Assessment/Plan: 1. Encounter for routine adult health examination with abnormal findings Age-appropriate preventive screenings and vaccinations discussed, annual physical exam completed. Routine labs for health maintenance drawn and discussed previously. PHM updated.   2. Gastroesophageal reflux disease without esophagitis Lansoprazole prescribed to alleviate acid reflux. - lansoprazole (PREVACID) 30 MG capsule; Take 1 capsule (30 mg total) by mouth 2 (two) times daily before a meal.  Dispense: 60 capsule; Refill: 2  3. Severe menstrual cramps Referred to OB/GYN for further evaluation of severe menstrual cramps.  Patient wants to take medication to help alleviate cramps but is currently trying to get pregnant and does not want to be on contraception. - Ambulatory referral to Obstetrics / Gynecology - traMADol (ULTRAM) 50 MG tablet; Take 1 tablet (50 mg total) by mouth every 6 (six) hours as needed for up to 5 days for severe pain.  Dispense: 20 tablet; Refill: 0  4. Chronic pain  of left wrist Patient referred to orthopedic surgery for further evaluation, tramadol prescribed to alleviate severe pain - Ambulatory referral to  Orthopedic Surgery  5. Nausea and vomiting, unspecified vomiting type Urine pregnancy test negative - POCT Pregnancy, Urine  6. Dysuria Routine urinalysis done - UA/M w/rflx Culture, Routine - Microscopic Examination - Urine Culture, Reflex      General Counseling: Lisvet verbalizes understanding of the findings of todays visit and agrees with plan of treatment. I have discussed any further diagnostic evaluation that may be needed or ordered today. We also reviewed her medications today. she has been encouraged to call the office with any questions or concerns that should arise related to todays visit.    Orders Placed This Encounter  Procedures   Microscopic Examination   Urine Culture, Reflex   UA/M w/rflx Culture, Routine   Ambulatory referral to Orthopedic Surgery   Ambulatory referral to Obstetrics / Gynecology    Meds ordered this encounter  Medications   DISCONTD: simethicone (MYLICON) 80 MG chewable tablet    Sig: Chew 1 tablet (80 mg total) by mouth every 6 (six) hours as needed for flatulence.    Dispense:  120 tablet    Refill:  0   lansoprazole (PREVACID) 30 MG capsule    Sig: Take 1 capsule (30 mg total) by mouth 2 (two) times daily before a meal.    Dispense:  60 capsule    Refill:  2   DISCONTD: ondansetron (ZOFRAN) 4 MG tablet    Sig: Take 1 tablet (4 mg total) by mouth every 8 (eight) hours as needed for nausea or vomiting.    Dispense:  60 tablet    Refill:  0   traMADol (ULTRAM) 50 MG tablet    Sig: Take 1 tablet (50 mg total) by mouth every 6 (six) hours as needed for up to 5 days for severe pain.    Dispense:  20 tablet    Refill:  0    Return in about 1 month (around 08/10/2021) for F/U, eval new med, [facility] PCP.   Total time spent:30 Minutes Time spent includes review of chart, medications, test results, and follow up plan with the patient.   [place] Controlled Substance Database was reviewed by me.  This patient was seen by Jonetta Osgood,  FNP-C in collaboration with Dr. Clayborn Bigness as a part of collaborative care agreement.  Taelor Waymire R. Valetta Fuller, MSN, FNP-C Internal medicine

## 2021-07-14 ENCOUNTER — Telehealth: Payer: Self-pay

## 2021-07-14 NOTE — Telephone Encounter (Signed)
Ortho surgery referral sent via [facility] to [facility]

## 2021-07-14 NOTE — Telephone Encounter (Signed)
Gyn referral sent via Epic to [facility]

## 2021-07-16 ENCOUNTER — Other Ambulatory Visit: Payer: Self-pay | Admitting: Nurse Practitioner

## 2021-07-16 LAB — MICROSCOPIC EXAMINATION: Casts: NONE SEEN /lpf

## 2021-07-16 LAB — UA/M W/RFLX CULTURE, ROUTINE
Bilirubin, UA: NEGATIVE
Glucose, UA: NEGATIVE
Nitrite, UA: NEGATIVE
RBC, UA: NEGATIVE
Specific Gravity, UA: 1.029 (ref 1.005–1.030)
Urobilinogen, Ur: 1 mg/dL (ref 0.2–1.0)
pH, UA: 6 (ref 5.0–7.5)

## 2021-07-16 LAB — URINE CULTURE, REFLEX

## 2021-07-17 ENCOUNTER — Telehealth: Payer: Self-pay

## 2021-07-17 NOTE — Telephone Encounter (Signed)
error 

## 2021-07-17 NOTE — Telephone Encounter (Signed)
Referral declined by [facility] due to oon with insurance. Redirected to [facility]

## 2021-07-18 NOTE — Telephone Encounter (Signed)
Spoke to pt, informed her of results

## 2021-07-18 NOTE — Telephone Encounter (Signed)
Rejected by [facility]. Not accepting Friday insurance. Redirected to [facility] in [place] via [facility]

## 2021-07-20 ENCOUNTER — Other Ambulatory Visit: Payer: Self-pay

## 2021-07-20 ENCOUNTER — Telehealth: Payer: Self-pay

## 2021-07-20 ENCOUNTER — Ambulatory Visit: Payer: 59 | Admitting: Gastroenterology

## 2021-07-20 NOTE — Telephone Encounter (Signed)
Patient called to reschedule her appointment today at 3:00pm with Dr. Allegra Lai. Informed patient that it would be a No show charge of 50 dollars since she did not give [place] a 24 hour notice. She states she did not know till this morning she was going to have a emergency with her mom. She states if we charge her 50 dollars she will find a new GI doctor to go. I informed her that is up to our office manager if she gives you the no show charge. Patient reschedule appointment to 09/19/21

## 2021-07-25 ENCOUNTER — Telehealth: Payer: Self-pay

## 2021-07-25 ENCOUNTER — Other Ambulatory Visit: Payer: Self-pay

## 2021-07-25 MED ORDER — SIMETHICONE 80 MG PO CHEW: 80.0000 mg | CHEWABLE_TABLET | Freq: Four times a day (QID) | ORAL | 0 refills | Status: AC | PRN

## 2021-07-25 NOTE — Telephone Encounter (Signed)
Pt called that [facility] don't have simethicone send pres to [facility]

## 2021-07-26 ENCOUNTER — Encounter: Payer: 59 | Admitting: Nurse Practitioner

## 2021-08-03 ENCOUNTER — Ambulatory Visit: Payer: 59 | Admitting: Orthopedic Surgery

## 2021-08-10 ENCOUNTER — Ambulatory Visit: Payer: 59 | Admitting: Nurse Practitioner

## 2021-08-12 ENCOUNTER — Encounter: Payer: Self-pay | Admitting: Nurse Practitioner

## 2021-08-12 LAB — POCT URINE PREGNANCY: Preg Test, Ur: NEGATIVE

## 2021-08-24 ENCOUNTER — Ambulatory Visit: Payer: 59 | Admitting: Orthopedic Surgery

## 2021-08-31 ENCOUNTER — Other Ambulatory Visit: Payer: Self-pay | Admitting: Nurse Practitioner

## 2021-08-31 ENCOUNTER — Encounter: Payer: 59 | Admitting: Obstetrics

## 2021-08-31 DIAGNOSIS — N946 Dysmenorrhea, unspecified: Secondary | ICD-10-CM

## 2021-09-19 ENCOUNTER — Ambulatory Visit: Payer: 59 | Admitting: Gastroenterology

## 2021-09-19 ENCOUNTER — Other Ambulatory Visit: Payer: Self-pay

## 2021-09-19 ENCOUNTER — Encounter: Payer: Self-pay | Admitting: Gastroenterology

## 2021-09-26 ENCOUNTER — Ambulatory Visit: Payer: Medicaid Other | Admitting: Nurse Practitioner

## 2021-09-28 ENCOUNTER — Ambulatory Visit: Payer: Self-pay | Admitting: Nurse Practitioner

## 2021-10-02 ENCOUNTER — Telehealth: Payer: Self-pay

## 2021-10-02 ENCOUNTER — Other Ambulatory Visit: Payer: Self-pay

## 2021-10-02 DIAGNOSIS — K219 Gastro-esophageal reflux disease without esophagitis: Secondary | ICD-10-CM

## 2021-10-02 MED ORDER — LANSOPRAZOLE 30 MG PO CPDR: 30.0000 mg | DELAYED_RELEASE_CAPSULE | Freq: Two times a day (BID) | ORAL | 2 refills | Status: DC

## 2021-10-02 NOTE — Telephone Encounter (Signed)
PA sent for LANSOPRAZOLE 30 mg to cover my meds 10/02/21 and came back denied.  Pt informed of the denial and was advised to check with pharmacy and they can do rx with [facility].  Pt was ok with that ?

## 2021-10-02 NOTE — Telephone Encounter (Signed)
Pt called and advised that we needed to do a Prior auth on her medication LANSPRAZOLE 30 MG due to her new insurance will not cover the pantoprazole.  I called pt's pharmacy and the  pharmacist Marylu Lund advised that pt will need to bring in the insurance card for them to file to insurance before they can determine if the medication needs a PA.  The pharmacist also gave me the [facility] prices for both meds as long as pt doesn't have medicaid.  I called pt back and informed her that she will need to take the insurance card to pharmacy before we can start a PA.  I also informed pt that she can purchase either medication with [facility] price if she chooses.   ?Pantoprazole 40 mg was $17.67 ?Lansoprazole 30 mg was $ 21.26 ? ?Pt gave Tat the insurance ID # for her new [employer] insurance ?[id number] ?

## 2021-11-01 ENCOUNTER — Ambulatory Visit: Payer: 59 | Admitting: Dermatology

## 2021-11-27 ENCOUNTER — Other Ambulatory Visit: Payer: BLUE CROSS/BLUE SHIELD

## 2021-11-29 ENCOUNTER — Ambulatory Visit: Payer: BLUE CROSS/BLUE SHIELD | Admitting: Nurse Practitioner

## 2021-11-30 ENCOUNTER — Encounter: Payer: Self-pay | Admitting: Nurse Practitioner

## 2021-11-30 ENCOUNTER — Ambulatory Visit (INDEPENDENT_AMBULATORY_CARE_PROVIDER_SITE_OTHER): Payer: BLUE CROSS/BLUE SHIELD | Admitting: Nurse Practitioner

## 2021-11-30 VITALS — BP 130/70 | HR 98 | Temp 97.6°F | Resp 16 | Ht 63.0 in | Wt 156.0 lb

## 2021-11-30 DIAGNOSIS — Z111 Encounter for screening for respiratory tuberculosis: Secondary | ICD-10-CM | POA: Diagnosis not present

## 2021-11-30 DIAGNOSIS — K219 Gastro-esophageal reflux disease without esophagitis: Secondary | ICD-10-CM

## 2021-11-30 DIAGNOSIS — L858 Other specified epidermal thickening: Secondary | ICD-10-CM | POA: Diagnosis not present

## 2021-11-30 DIAGNOSIS — L2389 Allergic contact dermatitis due to other agents: Secondary | ICD-10-CM

## 2021-11-30 DIAGNOSIS — Z760 Encounter for issue of repeat prescription: Secondary | ICD-10-CM

## 2021-11-30 MED ORDER — TRANEXAMIC ACID 650 MG PO TABS: 1300.0000 mg | ORAL_TABLET | Freq: Three times a day (TID) | ORAL | 3 refills | Status: AC

## 2021-11-30 MED ORDER — LANSOPRAZOLE 30 MG PO CPDR: 30.0000 mg | DELAYED_RELEASE_CAPSULE | Freq: Two times a day (BID) | ORAL | 2 refills | Status: DC

## 2021-11-30 MED ORDER — BETAMETHASONE DIPROPIONATE AUG 0.05 % EX CREA: TOPICAL_CREAM | Freq: Two times a day (BID) | CUTANEOUS | 2 refills | Status: DC

## 2021-11-30 MED ORDER — MONTELUKAST SODIUM 10 MG PO TABS: 10.0000 mg | ORAL_TABLET | Freq: Every day | ORAL | 3 refills | Status: AC

## 2021-11-30 NOTE — Progress Notes (Signed)
[facility] [address] [place], [place] [address]  Internal MEDICINE  Office Visit Note  Patient Name: Yvonne Herrera  [id number]  [id number]  Date of Service: 11/30/2021  Chief Complaint  Patient presents with   Follow-up    Follow up on acid reflux medication   Gastroesophageal Reflux    HPI Paulett presents for a follow up visit for GERD and med refill --last office visit was in February --has lost 11 lbs since her last visit --was taking pantoprazole, want to try something with less side effect  --starting new job, need TB test, will do TB gold blood test --has keratosis pilaris on her arms, wants to have something to use on it to improve. --need refill of tranexamic acid tabs and montelukast --wants to take test to see if she is allergic to mosquitoes has very inflamed reactions to mosquito bites.     Current Medication: Outpatient Encounter Medications as of 11/30/2021  Medication Sig   Ascorbic Acid (VITAMIN C) 100 MG tablet Take 100 mg by mouth daily.   augmented betamethasone dipropionate (DIPROLENE-AF) 0.05 % cream Apply topically 2 (two) times daily. To affected area until resolved.   BLACK ELDERBERRY PO Take 1 each by mouth in the morning and at bedtime.   cholecalciferol (VITAMIN D3) 25 MCG (1000 UNIT) tablet Take 1,000 Units by mouth daily.   cyclobenzaprine (FLEXERIL) 5 MG tablet Take by mouth.   ketoconazole (NIZORAL) 2 % shampoo WASH AFFECTED AREAS TWO-THREE TIMES A WEEK. LEAVE ON FOR 10 MINUTES BEFORE RINSING.   meloxicam (MOBIC) 7.5 MG tablet Take 1 tablet by mouth once daily   montelukast (SINGULAIR) 10 MG tablet Take 1 tablet (10 mg total) by mouth at bedtime.   Multiple Vitamins-Minerals (MULTIVITAMIN WITH MINERALS) tablet Take 1 tablet by mouth daily.   ondansetron (ZOFRAN) 4 MG tablet Take 4 mg by mouth every 8 (eight) hours as needed.   pantoprazole (PROTONIX) 40 MG tablet Take 40 mg by mouth 2 (two) times daily.   Prenatal Vit-Fe  Fumarate-FA (MULTIVITAMIN-PRENATAL) 27-0.8 MG TABS tablet Take 1 tablet by mouth daily at 12 noon.   simethicone (MYLICON) 80 MG chewable tablet Chew 1 tablet (80 mg total) by mouth every 6 (six) hours as needed for flatulence.   [DISCONTINUED] lansoprazole (PREVACID) 30 MG capsule Take 1 capsule (30 mg total) by mouth 2 (two) times daily before a meal.   [DISCONTINUED] tranexamic acid (LYSTEDA) 650 MG TABS tablet Take by mouth.   lansoprazole (PREVACID) 30 MG capsule Take 1 capsule (30 mg total) by mouth 2 (two) times daily before a meal.   tranexamic acid (LYSTEDA) 650 MG TABS tablet Take 2 tablets (1,300 mg total) by mouth 3 (three) times daily. During menstrual cycle.   No facility-administered encounter medications on file as of 11/30/2021.    Surgical History: Past Surgical History:  Procedure Laterality Date   nexplanon  10/25/2012   [facility]    WRIST SURGERY Left     Medical History: Past Medical History:  Diagnosis Date   Dysmenorrhea    GERD (gastroesophageal reflux disease)    Menorrhagia    Obesity (BMI 30.0-34.9)     Family History: Family History  Problem Relation Age of Onset   GER disease Father    Breast cancer Maternal Aunt    Cancer Maternal Aunt    Cancer Maternal Aunt    Hypertension Maternal Uncle    Stroke Maternal Uncle    Colon cancer Maternal Grandmother    Hypertension  Maternal Grandfather    Heart disease Maternal Grandfather    Kidney failure Maternal Grandfather    Hypertension Paternal Grandmother    Stroke Other    Breast cancer Other        2 maternal great aunts with breast cancer (48s) and a maternal great aunt with colon cancer (79)   Stomach cancer Other     Social History   Socioeconomic History   Marital status: Single    Spouse name: Not on file   Number of children: 0   Years of education: Not on file   Highest education level: Not on file  Occupational History   Occupation: Patient care assistant  Tobacco Use   Smoking  status: Never   Smokeless tobacco: Never  Vaping Use   Vaping Use: Never used  Substance and Sexual Activity   Alcohol use: Not Currently   Drug use: Yes    Types: Marijuana    Comment: daily   Sexual activity: Yes    Partners: Male    Birth control/protection: None  Other Topics Concern   Not on file  Social History Narrative   Not on file   Social Determinants of Health   Financial Resource Strain: Not on file  Food Insecurity: Not on file  Transportation Needs: Not on file  Physical Activity: Inactive (04/17/2017)   Exercise Vital Sign    Days of Exercise per Week: 0 days    Minutes of Exercise per Session: 0 min  Stress: No Stress Concern Present (04/17/2017)   [employer] of Occupational Health - Occupational Stress Questionnaire    Feeling of Stress : Not at all  Social Connections: Moderately Isolated (04/17/2017)   Social Connection and Isolation Panel [NHANES]    Frequency of Communication with Friends and Family: Once a week    Frequency of Social Gatherings with Friends and Family: Once a week    Attends Religious Services: Never    Database administrator or Organizations: No    Attends Banker Meetings: Never    Marital Status: Living with partner  Intimate Partner Violence: Not At Risk (04/17/2017)   Humiliation, Afraid, Rape, and Kick questionnaire    Fear of Current or Ex-Partner: No    Emotionally Abused: No    Physically Abused: No    Sexually Abused: No      Review of Systems  Constitutional:  Negative for activity change, appetite change, chills, fatigue, fever and unexpected weight change.  HENT: Negative.  Negative for congestion, ear pain, rhinorrhea, sore throat and trouble swallowing.   Eyes: Negative.   Respiratory: Negative.  Negative for cough, chest tightness, shortness of breath and wheezing.   Cardiovascular: Negative.  Negative for chest pain.  Gastrointestinal: Negative.  Negative for abdominal pain, blood in  stool, constipation, diarrhea, nausea and vomiting.  Endocrine: Negative.   Genitourinary: Negative.  Negative for difficulty urinating, dysuria, frequency, hematuria and urgency.  Musculoskeletal: Negative.  Negative for arthralgias, back pain, joint swelling, myalgias and neck pain.  Skin: Negative.  Negative for rash and wound.  Allergic/Immunologic: Negative.  Negative for immunocompromised state.  Neurological: Negative.  Negative for dizziness, seizures, numbness and headaches.  Hematological: Negative.   Psychiatric/Behavioral: Negative.  Negative for behavioral problems, self-injury and suicidal ideas. The patient is not nervous/anxious.     Vital Signs: BP 130/70   Pulse 98   Temp 97.6 F (36.4 C)   Resp 16   Ht 5\' 3"  (1.6 m)   Wt 156  lb (70.8 kg)   SpO2 98%   BMI 27.63 kg/m    Physical Exam Vitals reviewed.  Constitutional:      General: She is not in acute distress.    Appearance: Normal appearance. She is not ill-appearing.  HENT:     Head: Normocephalic and atraumatic.  Eyes:     Pupils: Pupils are equal, round, and reactive to light.  Cardiovascular:     Rate and Rhythm: Normal rate and regular rhythm.  Pulmonary:     Effort: Pulmonary effort is normal. No respiratory distress.  Neurological:     Mental Status: She is alert and oriented to person, place, and time.  Psychiatric:        Mood and Affect: Mood normal.        Behavior: Behavior normal.        Assessment/Plan: 1. Gastroesophageal reflux disease without esophagitis Discontinue pantoprazole, will try lansoprazole, follow up in 1 month - lansoprazole (PREVACID) 30 MG capsule; Take 1 capsule (30 mg total) by mouth 2 (two) times daily before a meal.  Dispense: 60 capsule; Refill: 2  2. Allergic contact dermatitis due to other agents Lab ordered to test for mosquito allergy per request - I071-IgE Whole Body: Mosquito  3. Keratosis pilaris Topical ordered  - augmented betamethasone  dipropionate (DIPROLENE-AF) 0.05 % cream; Apply topically 2 (two) times daily. To affected area until resolved.  Dispense: 30 g; Refill: 2  4. Encounter for screening for respiratory tuberculosis Lab ordered, TB screening for new job - QuantiFERON-TB Gold Plus  5. Medication refill - montelukast (SINGULAIR) 10 MG tablet; Take 1 tablet (10 mg total) by mouth at bedtime.  Dispense: 30 tablet; Refill: 3 - tranexamic acid (LYSTEDA) 650 MG TABS tablet; Take 2 tablets (1,300 mg total) by mouth 3 (three) times daily. During menstrual cycle.  Dispense: 30 tablet; Refill: 3   General Counseling: Skyelynn verbalizes understanding of the findings of todays visit and agrees with plan of treatment. I have discussed any further diagnostic evaluation that may be needed or ordered today. We also reviewed her medications today. she has been encouraged to call the office with any questions or concerns that should arise related to todays visit.    Orders Placed This Encounter  Procedures   I071-IgE Whole Body: Mosquito   QuantiFERON-TB Gold Plus    Meds ordered this encounter  Medications   lansoprazole (PREVACID) 30 MG capsule    Sig: Take 1 capsule (30 mg total) by mouth 2 (two) times daily before a meal.    Dispense:  60 capsule    Refill:  2   montelukast (SINGULAIR) 10 MG tablet    Sig: Take 1 tablet (10 mg total) by mouth at bedtime.    Dispense:  30 tablet    Refill:  3   augmented betamethasone dipropionate (DIPROLENE-AF) 0.05 % cream    Sig: Apply topically 2 (two) times daily. To affected area until resolved.    Dispense:  30 g    Refill:  2   tranexamic acid (LYSTEDA) 650 MG TABS tablet    Sig: Take 2 tablets (1,300 mg total) by mouth 3 (three) times daily. During menstrual cycle.    Dispense:  30 tablet    Refill:  3    Return in about 1 month (around 12/30/2021) for F/U, Rindi [facility] PCP.   Total time spent:30 Minutes Time spent includes review of chart, medications, test results, and  follow up plan with the patient.   [place] Controlled Substance  Database was reviewed by me.  This patient was seen by Sallyanne Kuster, FNP-C in collaboration with Dr. Beverely Risen as a part of collaborative care agreement.   Murrel Freet R. Tedd Sias, MSN, FNP-C Internal medicine

## 2021-12-08 ENCOUNTER — Other Ambulatory Visit: Payer: BLUE CROSS/BLUE SHIELD

## 2021-12-10 IMAGING — CR DG ABDOMEN 2V
1 series · 2 of 2 positions shown · non-contrast
Comparison: None.

CLINICAL DATA: Nausea vomiting for 2 weeks

EXAM:
ABDOMEN - 2 VIEW

[Series 1: dg abd 2 views · 0.14mm/px · 2 of 2 slices shown]
[im 1/2]
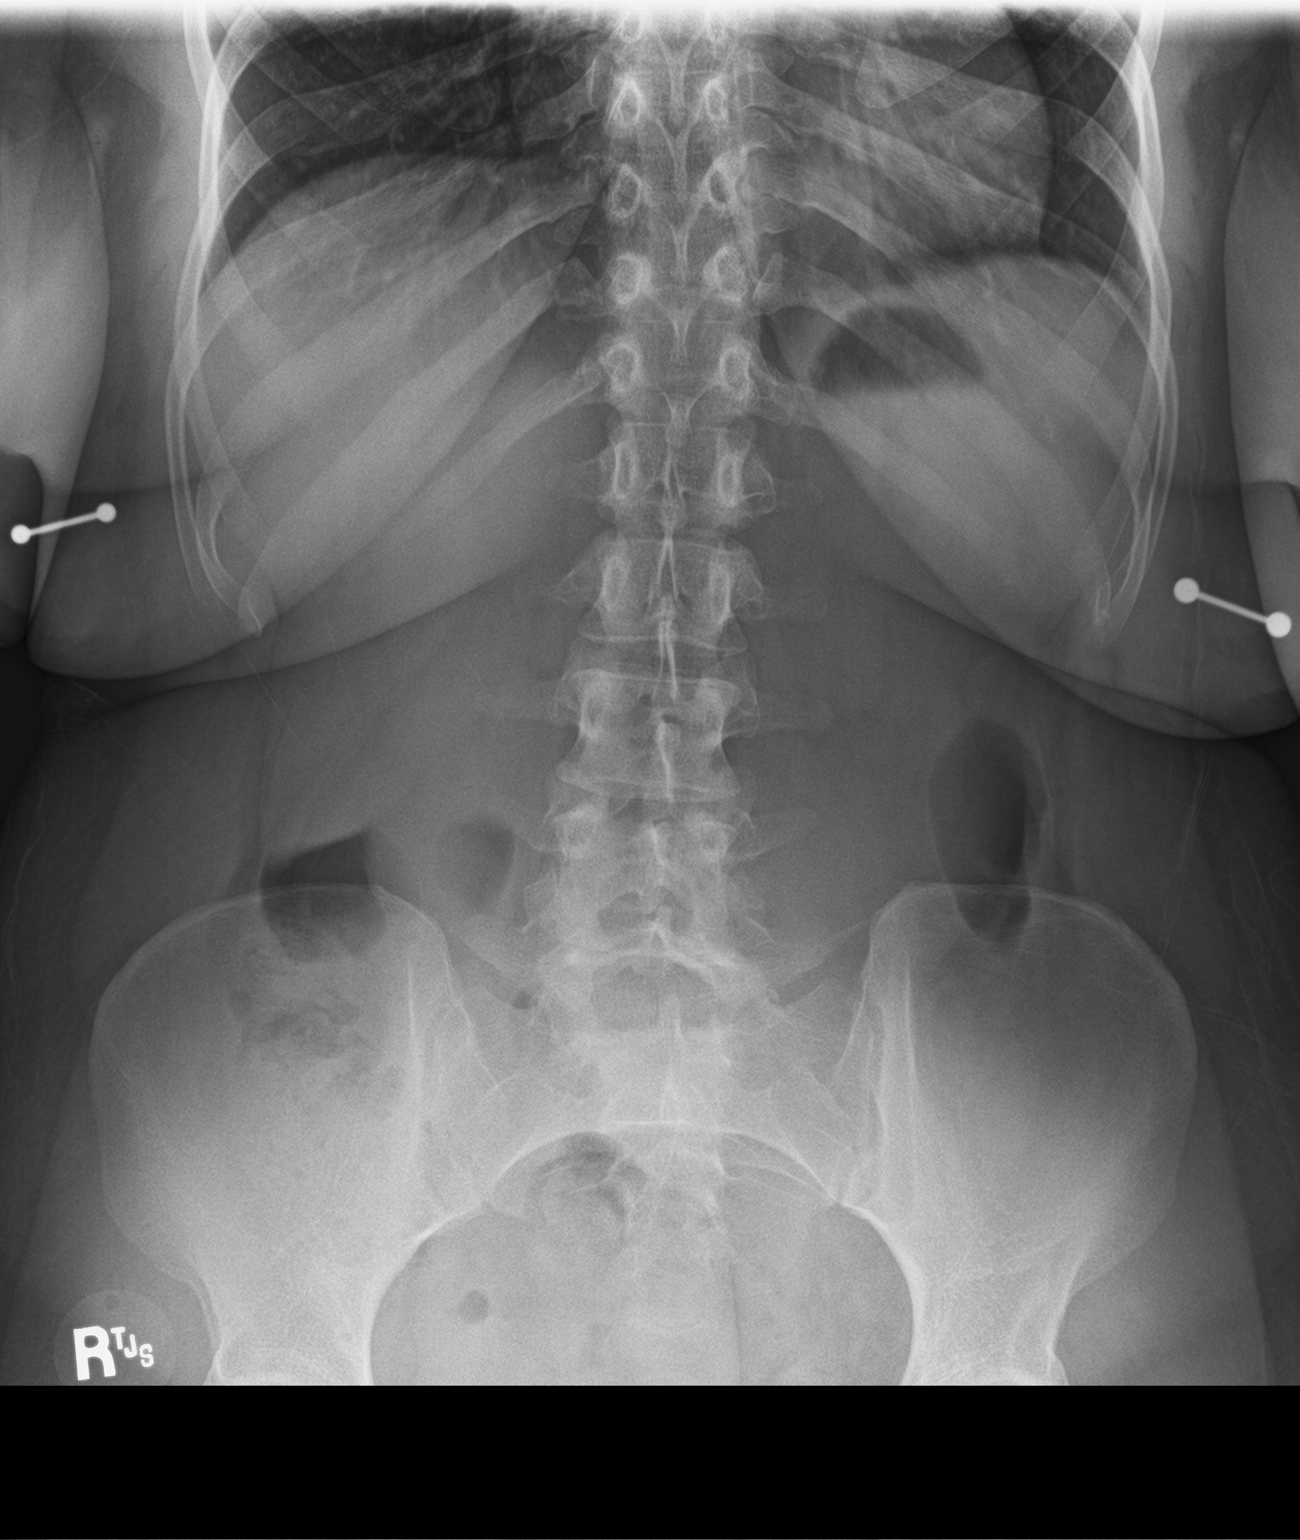
[im 2/2]
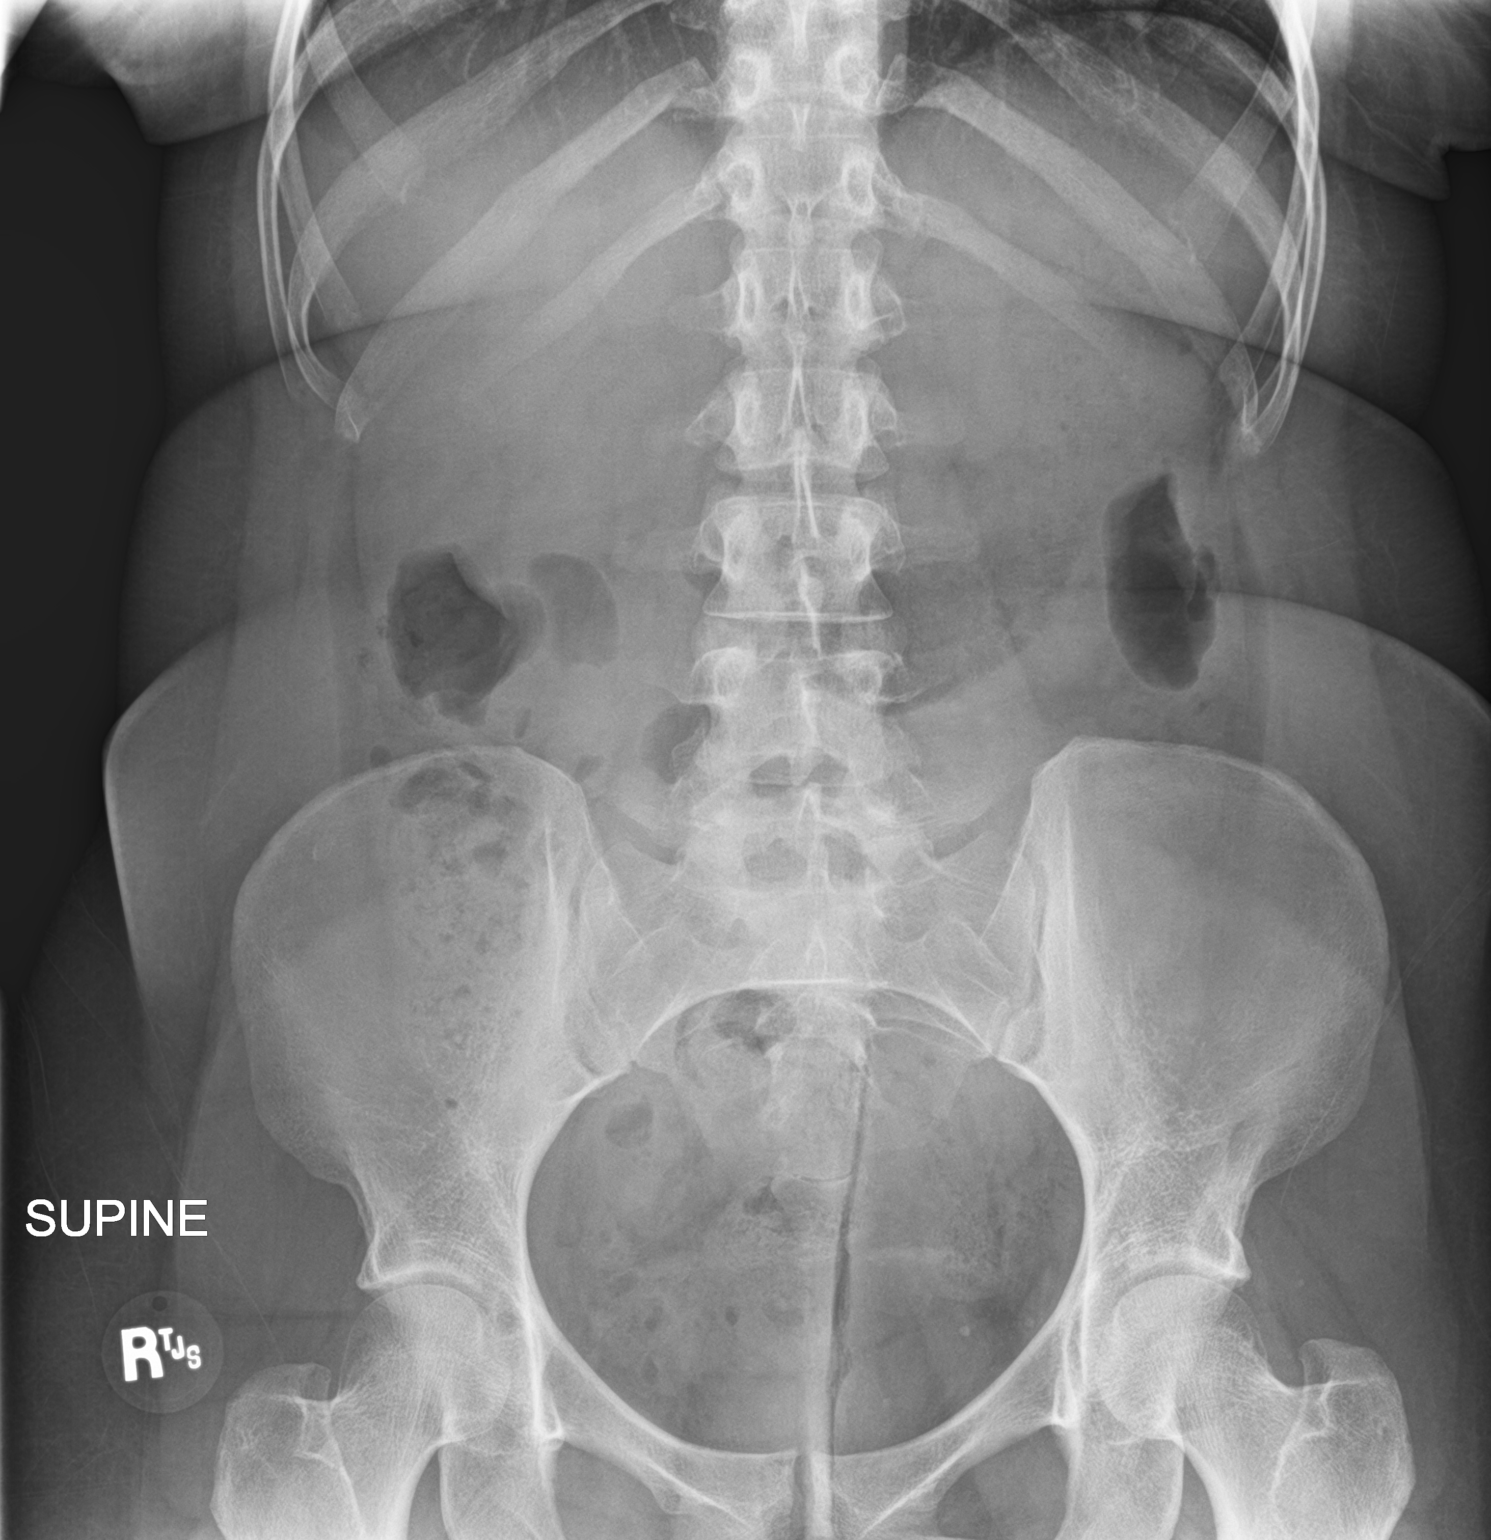

[2 of 2 positions shown; findings below may reference images not displayed]

FINDINGS: The bowel gas pattern is normal. There is no evidence of free air.
No radio-opaque calculi or other significant radiographic
abnormality is seen.
IMPRESSION: No acute abnormality noted.

## 2021-12-10 IMAGING — CR DG WRIST COMPLETE 3+V*L*
1 series · 4 of 4 positions shown · non-contrast
Comparison: None.

CLINICAL DATA: History of prior left wrist surgery several years
ago with persistent pain and numbness, initial encounter

EXAM:
LEFT WRIST - COMPLETE 3+ VIEW

[Series 1: dg wrist complete left · 0.14mm/px · 4 of 4 slices shown]
[im 1/4]
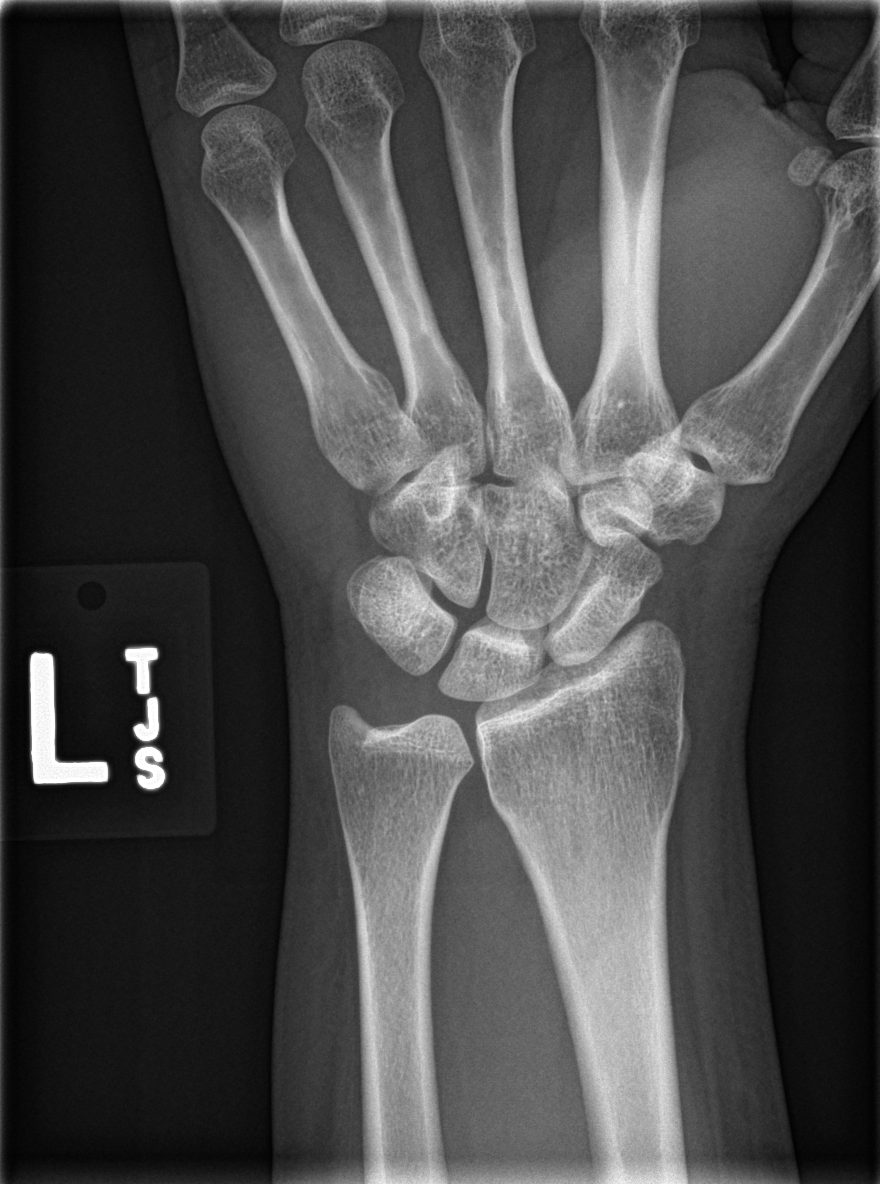
[im 2/4]
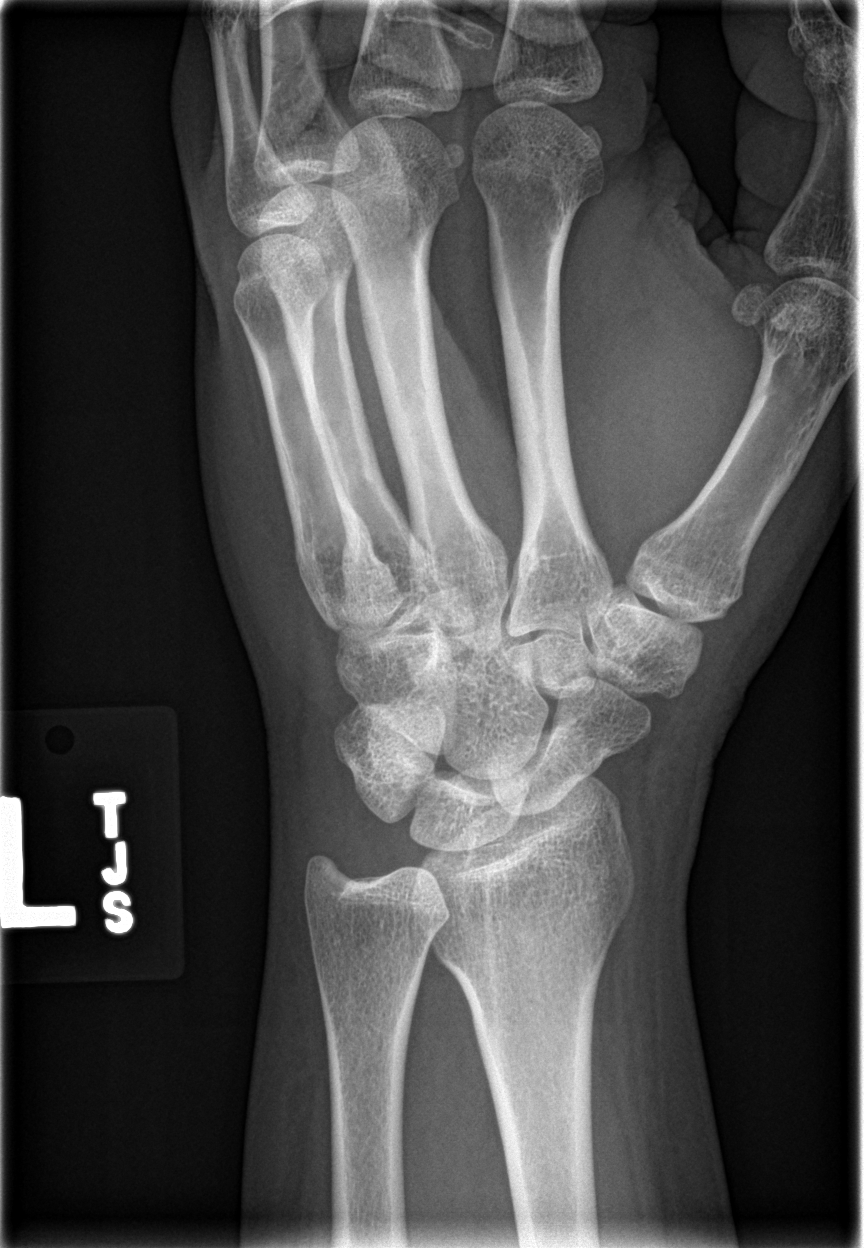
[im 3/4]
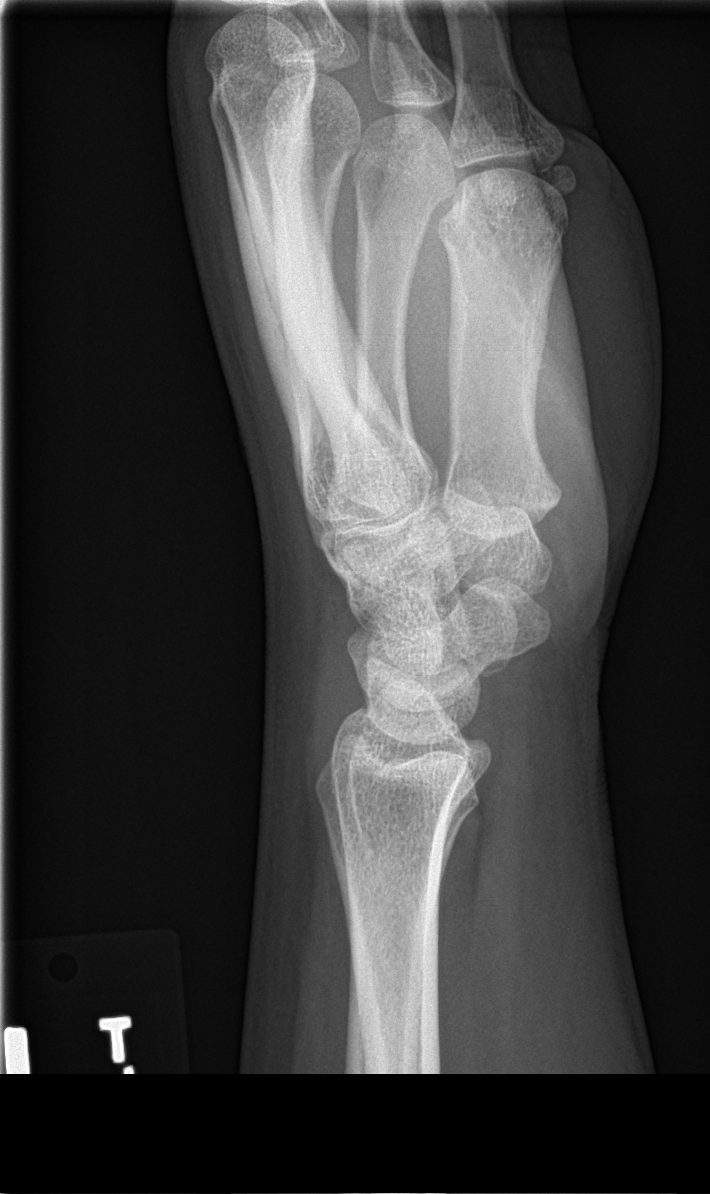
[im 4/4]
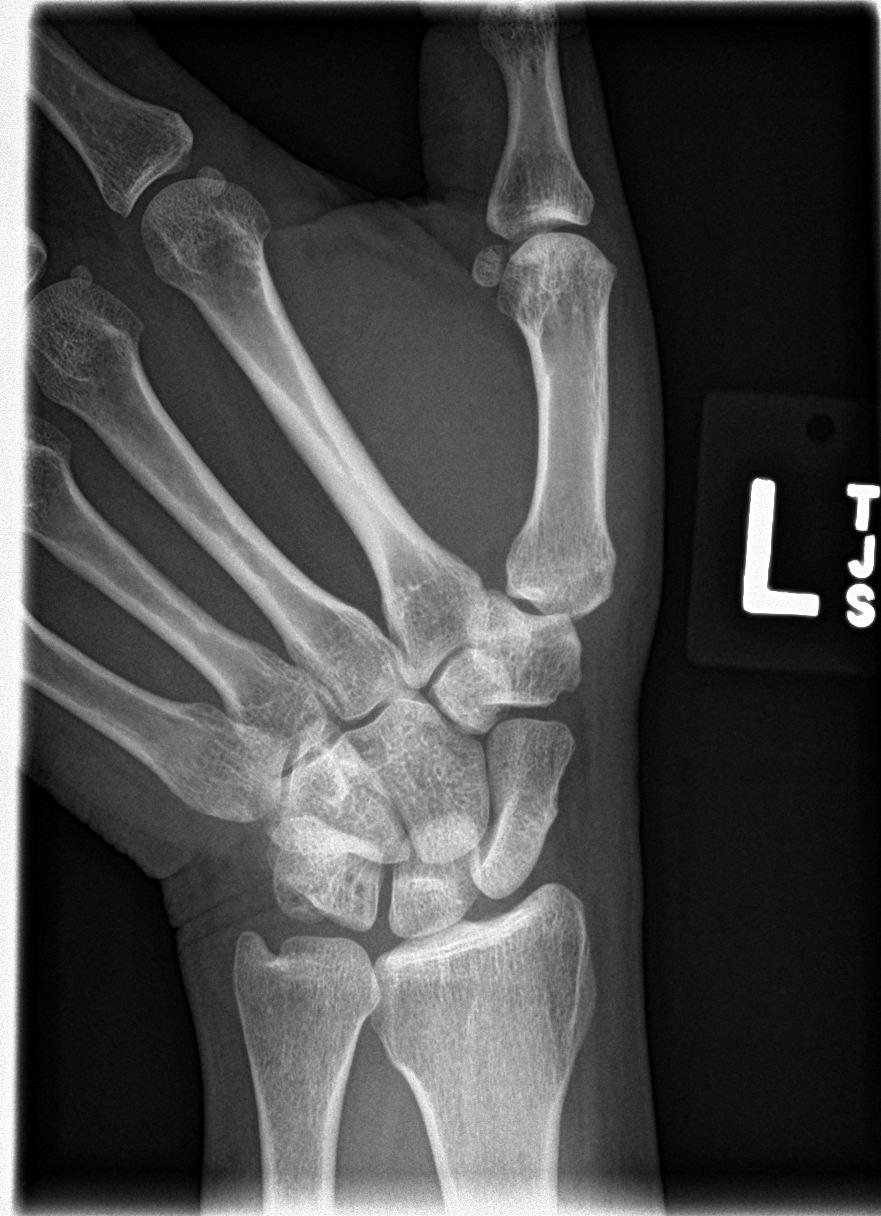

[4 of 4 positions shown; findings below may reference images not displayed]

FINDINGS: There is no evidence of fracture or dislocation. There is no
evidence of arthropathy or other focal bone abnormality. Soft
tissues are unremarkable.
IMPRESSION: No acute abnormality noted.

## 2021-12-16 LAB — QUANTIFERON-TB GOLD PLUS
QuantiFERON Mitogen Value: >10 IU/mL
QuantiFERON Nil Value: 0.04 IU/mL
QuantiFERON TB1 Ag Value: 0.05 IU/mL
QuantiFERON TB2 Ag Value: 0.06 IU/mL
QuantiFERON-TB Gold Plus: NEGATIVE

## 2021-12-17 LAB — I071-IGE WHOLE BODY: MOSQUITO: Mosquito IgE: <0.1 kU/L

## 2022-01-04 ENCOUNTER — Ambulatory Visit: Payer: BLUE CROSS/BLUE SHIELD | Admitting: Nurse Practitioner

## 2022-01-18 ENCOUNTER — Encounter: Payer: Self-pay | Admitting: Nurse Practitioner

## 2022-02-18 ENCOUNTER — Other Ambulatory Visit: Payer: Self-pay | Admitting: Nurse Practitioner

## 2022-02-18 DIAGNOSIS — L858 Other specified epidermal thickening: Secondary | ICD-10-CM

## 2022-04-09 ENCOUNTER — Other Ambulatory Visit: Payer: Self-pay | Admitting: Nurse Practitioner

## 2022-04-09 ENCOUNTER — Emergency Department
Admission: EM | Admit: 2022-04-09 | Discharge: 2022-04-09 | Disposition: A | Discharge status: Home / Self Care | Payer: BLUE CROSS/BLUE SHIELD | Attending: Emergency Medicine | Admitting: Emergency Medicine

## 2022-04-09 ENCOUNTER — Other Ambulatory Visit: Payer: Self-pay

## 2022-04-09 DIAGNOSIS — N946 Dysmenorrhea, unspecified: Secondary | ICD-10-CM | POA: Diagnosis not present

## 2022-04-09 DIAGNOSIS — R202 Paresthesia of skin: Secondary | ICD-10-CM | POA: Diagnosis not present

## 2022-04-09 DIAGNOSIS — R1084 Generalized abdominal pain: Secondary | ICD-10-CM | POA: Diagnosis present

## 2022-04-09 LAB — COMPREHENSIVE METABOLIC PANEL
ALT: 11 U/L (ref 0–44)
AST: 15 U/L (ref 15–41)
Albumin: 4.1 g/dL (ref 3.5–5.0)
Alkaline Phosphatase: 50 U/L (ref 38–126)
Anion gap: 5 (ref 5–15)
BUN: 9 mg/dL (ref 6–20)
CO2: 25 mmol/L (ref 22–32)
Calcium: 9 mg/dL (ref 8.9–10.3)
Chloride: 111 mmol/L (ref 98–111)
Creatinine, Ser: 0.8 mg/dL (ref 0.44–1.00)
GFR, Estimated: >60 mL/min (ref >60)
Glucose, Bld: 90 mg/dL (ref 70–99)
Potassium: 3.7 mmol/L (ref 3.5–5.1)
Sodium: 141 mmol/L (ref 135–145)
Total Bilirubin: 0.6 mg/dL (ref 0.3–1.2)
Total Protein: 7.6 g/dL (ref 6.5–8.1)

## 2022-04-09 LAB — CBC
HCT: 39.8 % (ref 36.0–46.0)
Hemoglobin: 13 g/dL (ref 12.0–15.0)
MCH: 29.9 pg (ref 26.0–34.0)
MCHC: 32.7 g/dL (ref 30.0–36.0)
MCV: 91.5 fL (ref 80.0–100.0)
Platelets: 244 10*3/uL (ref 150–400)
RBC: 4.35 MIL/uL (ref 3.87–5.11)
RDW: 12.9 % (ref 11.5–15.5)
WBC: 14.5 10*3/uL — ABNORMAL HIGH (ref 4.0–10.5)
nRBC: 0 % (ref 0.0–0.2)

## 2022-04-09 LAB — URINALYSIS, ROUTINE W REFLEX MICROSCOPIC
Bacteria, UA: NONE SEEN
Bilirubin Urine: NEGATIVE
Glucose, UA: NEGATIVE mg/dL
Ketones, ur: 80 mg/dL — AB
Leukocytes,Ua: NEGATIVE
Nitrite: NEGATIVE
Protein, ur: NEGATIVE mg/dL
RBC / HPF: >50 RBC/hpf — ABNORMAL HIGH (ref 0–5)
Specific Gravity, Urine: 1.023 (ref 1.005–1.030)
pH: 6 (ref 5.0–8.0)

## 2022-04-09 LAB — POC URINE PREG, ED: Preg Test, Ur: NEGATIVE

## 2022-04-09 LAB — LIPASE, BLOOD: Lipase: 31 U/L (ref 11–51)

## 2022-04-09 MED ORDER — HYDROCODONE-ACETAMINOPHEN 5-325 MG PO TABS: 1.0000 | ORAL_TABLET | Freq: Four times a day (QID) | ORAL | 0 refills | Status: DC | PRN

## 2022-04-09 MED ORDER — HYDROCODONE-ACETAMINOPHEN 5-325 MG PO TABS
1.0000 | ORAL_TABLET | Freq: Once | ORAL | Status: AC
  Administered 2022-04-09: 1 via ORAL
  Filled 2022-04-09: qty 1

## 2022-04-09 MED ORDER — HYDROCODONE-ACETAMINOPHEN 5-325 MG PO TABS: 1.0000 | ORAL_TABLET | Freq: Four times a day (QID) | ORAL | 0 refills | Status: AC | PRN

## 2022-04-09 MED ORDER — ACETAMINOPHEN 500 MG PO TABS
1000.0000 mg | ORAL_TABLET | Freq: Once | ORAL | Status: AC
Start: 2022-04-09 — End: 2022-04-09
  Administered 2022-04-09: 1000 mg via ORAL
  Filled 2022-04-09: qty 2

## 2022-04-09 NOTE — ED Triage Notes (Signed)
Pt to ED via [facility] from home. Pt reports she woke up from a 12min nap and woke up with abdominal pain that wraps around to her back. Pt also endorses N/V and chills. Pt denies urinary symptoms. Pt given fentanyl in route. 18g LAC.

## 2022-04-09 NOTE — Discharge Instructions (Signed)
Please follow with your primary care provider for symptoms not improving after your menstrual cycle is over. Take ibuprofen as directed on the packaging.

## 2022-04-09 NOTE — ED Notes (Signed)
See triage note  Presents with some abd pain and n/v   States she is having pain to lower back and abd  Afebrile on arrival

## 2022-04-09 NOTE — ED Provider Notes (Signed)
[facility] Provider Note    Event Date/Time   First MD Initiated Contact with Patient 04/09/22 1446     (approximate)   History   Abdominal Pain   HPI  Yvonne Herrera is a 26 y.o. female with history of GERD and remaining as listed in EMR presents to the emergency department for treatment and evaluation of abdominal cramping that stared this afternoon. She feels this is related to her menstrual cycle that is supposed to start tomorrow. She used to have bad period cramps, but that has gotten better over the years until today. She denies dysuria or vaginal discharge. Pain and nausea have improved after EMS gave medication enroute.      Physical Exam   Triage Vital Signs: ED Triage Vitals  Enc Vitals Group     BP 04/09/22 1427 108/74     Pulse Rate 04/09/22 1427 (!) 55     Resp 04/09/22 1427 18     Temp 04/09/22 1427 98.6 F (37 C)     Temp src --      SpO2 04/09/22 1427 99 %     Weight --      Height --      Head Circumference --      Peak Flow --      Pain Score 04/09/22 1428 0     Pain Loc --      Pain Edu? --      Excl. in GC? --     Most recent vital signs: Vitals:   04/09/22 1427 04/09/22 1808  BP: 108/74 110/70  Pulse: (!) 55 60  Resp: 18 18  Temp: 98.6 F (37 C) 98 F (36.7 C)  SpO2: 99% 99%     General: Awake, no distress.  CV:  Good peripheral perfusion.  Resp:  Normal effort.  Abd:  No distention. No focal abdominal tenderness. Diffuse lower/suprapubic tenderness. Other:  Bilateral LE: normal sensation, motor function intact.   ED Results / Procedures / Treatments   Labs (all labs ordered are listed, but only abnormal results are displayed) Labs Reviewed  CBC - Abnormal; Notable for the following components:      Result Value   WBC 14.5 (*)    All other components within normal limits  URINALYSIS, ROUTINE W REFLEX MICROSCOPIC - Abnormal; Notable for the following components:   Color, Urine YELLOW (*)     APPearance CLEAR (*)    Hgb urine dipstick LARGE (*)    Ketones, ur 80 (*)    RBC / HPF >50 (*)    All other components within normal limits  LIPASE, BLOOD  COMPREHENSIVE METABOLIC PANEL  POC URINE PREG, ED     EKG  Not indicated.   RADIOLOGY  Not indicated.   PROCEDURES:  Critical Care performed: No  Procedures   MEDICATIONS ORDERED IN ED: Medications  acetaminophen (TYLENOL) tablet 1,000 mg (1,000 mg Oral Given 04/09/22 1749)  HYDROcodone-acetaminophen (NORCO/VICODIN) 5-325 MG per tablet 1 tablet (1 tablet Oral Given 04/09/22 1840)     IMPRESSION / MDM / ASSESSMENT AND PLAN / ED COURSE  I reviewed the triage vital signs and the nursing notes.                              Differential diagnosis includes, but is not limited to   Patient's presentation is most consistent with acute complicated illness / injury requiring diagnostic workup.  26  year old female presenting to the emergency department for treatment and evaluation of lower abdominal/pelvic cramping and pain resembling previous dysmenorrhea when she was younger. She is due for onset of menses tomorrow. She is also experiencing tingling of the lower extremities. EMS gave fentanyl and zofran enroute which have helped.   Labs are reassuring.  Pregnancy test is negative.  Urinalysis is without indication of acute cystitis.  Findings of large amount of hemoglobin, ketones, red blood cells are secondary to onset of menses while here in the emergency department.   She will be discharged home with instruction to follow up with primary care.   FINAL CLINICAL IMPRESSION(S) / ED DIAGNOSES   Final diagnoses:  Paresthesia  Dysmenorrhea     Note:  This document was prepared using Dragon voice recognition software and may include unintentional dictation errors.   Victorino Dike, FNP 04/09/22 1849    Lavonia Drafts, MD 04/11/22 1130

## 2022-04-09 NOTE — ED Triage Notes (Signed)
  Patient from home by EMS with c/o abdominal pain and lower back pain. No relief with ibuprofen. Reports N/V all morning.   EMS vitals: 55HR 120/80 b/p 99% RA 125CBG 98.4oral  18G left AC EMS administered 4mg  zofran

## 2022-05-21 ENCOUNTER — Other Ambulatory Visit: Payer: Self-pay

## 2022-06-25 ENCOUNTER — Encounter: Payer: Self-pay | Admitting: Nurse Practitioner

## 2022-06-25 ENCOUNTER — Ambulatory Visit (INDEPENDENT_AMBULATORY_CARE_PROVIDER_SITE_OTHER): Payer: BLUE CROSS/BLUE SHIELD | Admitting: Nurse Practitioner

## 2022-06-25 VITALS — BP 129/85 | HR 67 | Temp 98.0°F | Resp 16 | Ht 63.0 in | Wt 153.0 lb

## 2022-06-25 DIAGNOSIS — R55 Syncope and collapse: Secondary | ICD-10-CM

## 2022-06-25 DIAGNOSIS — K219 Gastro-esophageal reflux disease without esophagitis: Secondary | ICD-10-CM

## 2022-06-25 DIAGNOSIS — R824 Acetonuria: Secondary | ICD-10-CM | POA: Diagnosis not present

## 2022-06-25 DIAGNOSIS — N946 Dysmenorrhea, unspecified: Secondary | ICD-10-CM | POA: Diagnosis not present

## 2022-06-25 LAB — POCT GLYCOSYLATED HEMOGLOBIN (HGB A1C): Hemoglobin A1C: 5.2 % (ref 4.0–5.6)

## 2022-06-25 MED ORDER — TRAMADOL HCL 50 MG PO TABS: 50.0000 mg | ORAL_TABLET | Freq: Four times a day (QID) | ORAL | 0 refills | Status: AC | PRN

## 2022-06-25 MED ORDER — IBUPROFEN 800 MG PO TABS: 800.0000 mg | ORAL_TABLET | Freq: Three times a day (TID) | ORAL | 1 refills | Status: AC | PRN

## 2022-06-25 MED ORDER — LANSOPRAZOLE 30 MG PO CPDR: 30.0000 mg | DELAYED_RELEASE_CAPSULE | Freq: Two times a day (BID) | ORAL | 5 refills | Status: AC

## 2022-06-25 NOTE — Progress Notes (Signed)
[facility] [place], [place] [address]  Internal MEDICINE  Office Visit Note  Patient Name: Yvonne Herrera  [id number]  [id number]  Date of Service: 06/25/2022  Chief Complaint  Patient presents with   Acute Visit    Period cramps/ feels like passing out     HPI Yvonne Herrera presents for an acute sick visit for period pains and near-syncope. -- severe period pains during and just before her cycle starts, Low energy, pale, fatigue, woozy feeling --stopped going to OBGYN, did not take pain seriously. Found new OBGYN has future appt.   A1c 5.2 is normal  Acid reflux is bad, takes lansoprazole which helps Losing weight, wants to gain weight  Won't take birth control, wants to get pregnant, does not have any specific plan for pregnancy or a specific partner    Current Medication:  Outpatient Encounter Medications as of 06/25/2022  Medication Sig   Ascorbic Acid (VITAMIN C) 100 MG tablet Take 100 mg by mouth daily.   augmented betamethasone dipropionate (DIPROLENE-AF) 0.05 % cream APPLY  CREAM TOPICALLY TWICE DAILY TO AFFECTED AREA UNTIL  RESOLVED   BLACK ELDERBERRY PO Take 1 each by mouth in the morning and at bedtime.   cholecalciferol (VITAMIN D3) 25 MCG (1000 UNIT) tablet Take 1,000 Units by mouth daily.   cyclobenzaprine (FLEXERIL) 5 MG tablet Take by mouth.   ibuprofen (ADVIL) 800 MG tablet Take 1 tablet (800 mg total) by mouth every 8 (eight) hours as needed for cramping.   ketoconazole (NIZORAL) 2 % shampoo WASH AFFECTED AREAS TWO-THREE TIMES A WEEK. LEAVE ON FOR 10 MINUTES BEFORE RINSING.   meloxicam (MOBIC) 7.5 MG tablet Take 1 tablet by mouth once daily   montelukast (SINGULAIR) 10 MG tablet Take 1 tablet (10 mg total) by mouth at bedtime.   Multiple Vitamins-Minerals (MULTIVITAMIN WITH MINERALS) tablet Take 1 tablet by mouth daily.   ondansetron (ZOFRAN) 4 MG tablet Take 4 mg by mouth every 8 (eight) hours as needed.   Prenatal Vit-Fe Fumarate-FA  (MULTIVITAMIN-PRENATAL) 27-0.8 MG TABS tablet Take 1 tablet by mouth daily at 12 noon.   simethicone (MYLICON) 80 MG chewable tablet Chew 1 tablet (80 mg total) by mouth every 6 (six) hours as needed for flatulence.   traMADol (ULTRAM) 50 MG tablet Take 1-2 tablets (50-100 mg total) by mouth every 6 (six) hours as needed (period cramps).   tranexamic acid (LYSTEDA) 650 MG TABS tablet Take 2 tablets (1,300 mg total) by mouth 3 (three) times daily. During menstrual cycle.   [DISCONTINUED] lansoprazole (PREVACID) 30 MG capsule Take 1 capsule (30 mg total) by mouth 2 (two) times daily before a meal.   [DISCONTINUED] pantoprazole (PROTONIX) 40 MG tablet Take 40 mg by mouth 2 (two) times daily.   lansoprazole (PREVACID) 30 MG capsule Take 1 capsule (30 mg total) by mouth 2 (two) times daily before a meal.   No facility-administered encounter medications on file as of 06/25/2022.      Medical History: Past Medical History:  Diagnosis Date   Dysmenorrhea    GERD (gastroesophageal reflux disease)    Menorrhagia    Obesity (BMI 30.0-34.9)      Vital Signs: BP 129/85   Pulse 67   Temp 98 F (36.7 C)   Resp 16   Ht 5\' 3"  (1.6 m)   Wt 153 lb (69.4 kg)   SpO2 98%   BMI 27.10 kg/m    Review of Systems  Constitutional:  Positive for activity change  and fatigue.  Respiratory:  Negative for cough, chest tightness, shortness of breath and wheezing.   Cardiovascular: Negative.  Negative for chest pain and palpitations.  Gastrointestinal:  Positive for abdominal distention (bloating) and abdominal pain (severe menstrual cramps).  Genitourinary:  Positive for menstrual problem. Negative for vaginal bleeding, vaginal discharge and vaginal pain.  Musculoskeletal: Negative.   Neurological:  Positive for syncope (near-syncope), light-headedness and headaches.  Psychiatric/Behavioral:  Positive for sleep disturbance.     Physical Exam Constitutional:      General: She is not in acute distress.     Appearance: Normal appearance. She is not ill-appearing.  HENT:     Head: Normocephalic and atraumatic.  Eyes:     Pupils: Pupils are equal, round, and reactive to light.  Cardiovascular:     Rate and Rhythm: Normal rate and regular rhythm.  Pulmonary:     Effort: Pulmonary effort is normal. No respiratory distress.  Neurological:     Mental Status: She is alert and oriented to person, place, and time.  Psychiatric:        Mood and Affect: Mood normal.        Behavior: Behavior normal.       Assessment/Plan: 1. Dysmenorrhea Pelvic ultrasound ordered for further evaluation. Ibuprofen prescribed for menstrual cramping. Tramadol prescribed for more severe cramping and abdominal pain that ibuprofen cannot alleviate.  - US Pelvic Complete With Transvaginal; Future - traMADol (ULTRAM) 50 MG tablet; Take 1-2 tablets (50-100 mg total) by mouth every 6 (six) hours as needed (period cramps).  Dispense: 30 tablet; Refill: 0 - ibuprofen (ADVIL) 800 MG tablet; Take 1 tablet (800 mg total) by mouth every 8 (eight) hours as needed for cramping.  Dispense: 90 tablet; Refill: 1  2. Pre-syncope Woozy/faint or lightheaded near-fainting feeling which is occurs concomitantly with her menses. Pelvic ultrasound ordered to further evaluate/rule out a possible correlation with uterine or pelvic abnormality.  - US Pelvic Complete With Transvaginal; Future  3. Ketonuria A1c is normal at 5.2.  - POCT glycosylated hemoglobin (Hb A1C)  4. Gastroesophageal reflux disease without esophagitis Continue lansoprazole as prescribed.  - lansoprazole (PREVACID) 30 MG capsule; Take 1 capsule (30 mg total) by mouth 2 (two) times daily before a meal.  Dispense: 60 capsule; Refill: 5   General Counseling: Yvonne Herrera verbalizes understanding of the findings of todays visit and agrees with plan of treatment. I have discussed any further diagnostic evaluation that may be needed or ordered today. We also reviewed her  medications today. she has been encouraged to call the office with any questions or concerns that should arise related to todays visit.    Counseling:    Orders Placed This Encounter  Procedures   US Pelvic Complete With Transvaginal   POCT glycosylated hemoglobin (Hb A1C)    Meds ordered this encounter  Medications   lansoprazole (PREVACID) 30 MG capsule    Sig: Take 1 capsule (30 mg total) by mouth 2 (two) times daily before a meal.    Dispense:  60 capsule    Refill:  5   traMADol (ULTRAM) 50 MG tablet    Sig: Take 1-2 tablets (50-100 mg total) by mouth every 6 (six) hours as needed (period cramps).    Dispense:  30 tablet    Refill:  0   ibuprofen (ADVIL) 800 MG tablet    Sig: Take 1 tablet (800 mg total) by mouth every 8 (eight) hours as needed for cramping.    Dispense:  90 tablet  Refill:  1    Return if symptoms worsen or fail to improve, for Ultrasound.  [place] Controlled Substance Database was reviewed by me for overdose risk score (ORS)  Time spent:30 Minutes Time spent with patient included reviewing progress notes, labs, imaging studies, and discussing plan for follow up.   This patient was seen by Jonetta Osgood, FNP-C in collaboration with Dr. Clayborn Bigness as a part of collaborative care agreement.  Kawhi Diebold R. Valetta Fuller, MSN, FNP-C Internal Medicine

## 2022-06-30 ENCOUNTER — Encounter: Payer: Self-pay | Admitting: Nurse Practitioner

## 2022-07-05 ENCOUNTER — Encounter: Payer: BLUE CROSS/BLUE SHIELD | Admitting: Nurse Practitioner

## 2022-07-17 ENCOUNTER — Encounter: Payer: BLUE CROSS/BLUE SHIELD | Admitting: Nurse Practitioner

## 2022-07-20 ENCOUNTER — Telehealth: Payer: Self-pay

## 2022-07-20 ENCOUNTER — Other Ambulatory Visit: Payer: Self-pay | Admitting: Nurse Practitioner

## 2022-07-20 DIAGNOSIS — R109 Unspecified abdominal pain: Secondary | ICD-10-CM

## 2022-07-20 DIAGNOSIS — N946 Dysmenorrhea, unspecified: Secondary | ICD-10-CM

## 2022-07-20 NOTE — Progress Notes (Signed)
Patient called back reporting that she is in severe pain and about to pass out. She was crying and sobbing over the phone and yelling for someone to help her.  She declined an appointment and medication when offered.  We recommended that she go to the ER but she said she could not drive because she felt like she would pass out.  We offered to call 911 for her and she agreed.  911 was called and the patient's information and address was given to the dispatcher. The patient was then notified that 911 is on their way.

## 2022-07-20 NOTE — Telephone Encounter (Signed)
Pt friend crystal  called 911 came and told her she need seen by her primary care or go to urgent advised go to urgent care  due to we are done for day

## 2022-07-20 NOTE — Telephone Encounter (Signed)
Patient called back reporting that she is in severe pain and about to pass out. She was crying and sobbing over the phone and yelling for someone to help her.  She declined an appointment and medication when offered.  We recommended that she go to the ER but she said she could not drive because she felt like she would pass out.  We offered to call 911 for her and she agreed.  911 was called and the patient's information and address was given to the dispatcher. The patient was then notified that 911 is on their way.

## 2022-07-23 ENCOUNTER — Telehealth: Payer: Self-pay | Admitting: Internal Medicine

## 2022-07-23 NOTE — Telephone Encounter (Signed)
Patient discharged from practice due to appointment noncompliance. Letter mailed to patient-Yvonne Herrera

## 2022-08-10 ENCOUNTER — Other Ambulatory Visit: Payer: Self-pay | Admitting: Nurse Practitioner

## 2022-08-10 DIAGNOSIS — N946 Dysmenorrhea, unspecified: Secondary | ICD-10-CM

## 2022-08-10 DIAGNOSIS — L858 Other specified epidermal thickening: Secondary | ICD-10-CM

## 2022-08-14 ENCOUNTER — Telehealth: Payer: Self-pay

## 2022-08-14 NOTE — Telephone Encounter (Signed)
Completed P.A.for patient's Lansoprazole.

## 2022-08-30 ENCOUNTER — Encounter: Payer: Self-pay | Admitting: Emergency Medicine

## 2022-08-30 ENCOUNTER — Ambulatory Visit
Admission: EM | Admit: 2022-08-30 | Discharge: 2022-08-30 | Disposition: A | Discharge status: Home / Self Care | Payer: BLUE CROSS/BLUE SHIELD | Attending: Emergency Medicine | Admitting: Emergency Medicine

## 2022-08-30 DIAGNOSIS — O219 Vomiting of pregnancy, unspecified: Secondary | ICD-10-CM | POA: Insufficient documentation

## 2022-08-30 DIAGNOSIS — Z3A00 Weeks of gestation of pregnancy not specified: Secondary | ICD-10-CM | POA: Diagnosis not present

## 2022-08-30 LAB — URINALYSIS, ROUTINE W REFLEX MICROSCOPIC
Glucose, UA: NEGATIVE mg/dL
Hgb urine dipstick: NEGATIVE
Ketones, ur: >160 mg/dL — AB
Nitrite: NEGATIVE
Protein, ur: NEGATIVE mg/dL
Specific Gravity, Urine: >1.03 — ABNORMAL HIGH (ref 1.005–1.030)
pH: 6.5 (ref 5.0–8.0)

## 2022-08-30 LAB — COMPREHENSIVE METABOLIC PANEL
ALT: 13 U/L (ref 0–44)
AST: 16 U/L (ref 15–41)
Albumin: 4.3 g/dL (ref 3.5–5.0)
Alkaline Phosphatase: 46 U/L (ref 38–126)
Anion gap: 9 (ref 5–15)
BUN: 10 mg/dL (ref 6–20)
CO2: 21 mmol/L — ABNORMAL LOW (ref 22–32)
Calcium: 9.2 mg/dL (ref 8.9–10.3)
Chloride: 101 mmol/L (ref 98–111)
Creatinine, Ser: 0.67 mg/dL (ref 0.44–1.00)
GFR, Estimated: >60 mL/min (ref >60)
Glucose, Bld: 85 mg/dL (ref 70–99)
Potassium: 3.6 mmol/L (ref 3.5–5.1)
Sodium: 131 mmol/L — ABNORMAL LOW (ref 135–145)
Total Bilirubin: 0.6 mg/dL (ref 0.3–1.2)
Total Protein: 8.4 g/dL — ABNORMAL HIGH (ref 6.5–8.1)

## 2022-08-30 LAB — CBC WITH DIFFERENTIAL/PLATELET
Abs Immature Granulocytes: 0.03 10*3/uL (ref 0.00–0.07)
Basophils Absolute: 0 10*3/uL (ref 0.0–0.1)
Basophils Relative: 0 %
Eosinophils Absolute: 0 10*3/uL (ref 0.0–0.5)
Eosinophils Relative: 0 %
HCT: 42 % (ref 36.0–46.0)
Hemoglobin: 14.2 g/dL (ref 12.0–15.0)
Immature Granulocytes: 0 %
Lymphocytes Relative: 17 %
Lymphs Abs: 1.7 10*3/uL (ref 0.7–4.0)
MCH: 30.7 pg (ref 26.0–34.0)
MCHC: 33.8 g/dL (ref 30.0–36.0)
MCV: 90.7 fL (ref 80.0–100.0)
Monocytes Absolute: 0.6 10*3/uL (ref 0.1–1.0)
Monocytes Relative: 6 %
Neutro Abs: 7.5 10*3/uL (ref 1.7–7.7)
Neutrophils Relative %: 77 %
Platelets: 267 10*3/uL (ref 150–400)
RBC: 4.63 MIL/uL (ref 3.87–5.11)
RDW: 13.2 % (ref 11.5–15.5)
WBC: 9.8 10*3/uL (ref 4.0–10.5)
nRBC: 0 % (ref 0.0–0.2)

## 2022-08-30 LAB — URINALYSIS, MICROSCOPIC (REFLEX)

## 2022-08-30 MED ORDER — DOXYLAMINE-PYRIDOXINE 10-10 MG PO TBEC: 2.0000 | DELAYED_RELEASE_TABLET | Freq: Every evening | ORAL | 2 refills | Status: AC

## 2022-08-30 MED ORDER — ONDANSETRON 4 MG PO TBDP
4.0000 mg | ORAL_TABLET | Freq: Once | ORAL | Status: AC
  Administered 2022-08-30: 4 mg via ORAL

## 2022-08-30 NOTE — ED Triage Notes (Signed)
Patient presents to UC for nausea, fatigue, and vomiting x 2 days. Pt states she is pregnant. Hx of acid reflux. States she has zofran but has not taken her acid reflux med or zofran because she's unsure if safe to take during pregnancy. First OB appt in April.

## 2022-08-30 NOTE — ED Provider Notes (Signed)
[facility]    CSN: [id number] Arrival date & time: 08/30/22  1240      History   Chief Complaint Chief Complaint  Patient presents with   Emesis During Pregnancy   Nausea   Fatigue    HPI Yvonne Herrera is a 27 y.o. female.   HPI  27 year old female with past medical history significant for GERD, dysmenorrhea, and menorrhagia presenting for evaluation of nausea, fatigue, and vomiting for the past 2 days.  She has not taken her Zofran that she is prescribed or her Prevacid because she is currently pregnant and is unsure if it is safe to take in pregnancy.  Her first OB appointment is in April.  She denies any abdominal pain.  She states she is getting sick typically throughout the morning but she has increased salivary production throughout the day.  She is also had significant fatigue for the past 2 days and has not been able to get out of bed.  Past Medical History:  Diagnosis Date   Dysmenorrhea    GERD (gastroesophageal reflux disease)    Menorrhagia    Obesity (BMI 30.0-34.9)     Patient Active Problem List   Diagnosis Date Noted   GERD (gastroesophageal reflux disease) 05/21/2021   Other acne 10/04/2020   Dysmenorrhea 11/03/2018   History of dysmenorrhea 04/18/2017   Menorrhagia with regular cycle 08/02/2016    Past Surgical History:  Procedure Laterality Date   nexplanon  10/25/2012   [facility]    WRIST SURGERY Left     OB History     Gravida  1   Para  0   Term  0   Preterm  0   AB  0   Living  0      SAB  0   IAB  0   Ectopic  0   Multiple  0   Live Births  0            Home Medications    Prior to Admission medications   Medication Sig Start Date End Date Taking? Authorizing Provider  Doxylamine-Pyridoxine 10-10 MG TBEC Take 2 tablets by mouth at bedtime. Take 2 tablets at bedtime on days 1 and 2.  If nausea persists take 1 tablet in the morning on day 3 and 2 tablets at bedtime.  If symptoms still continue you may  take 1 tablet in the morning, 1 tablet mid afternoon, and 2 tablets at bedtime on day 4. 08/30/22  Yes Margarette Canada, NP  Ascorbic Acid (VITAMIN C) 100 MG tablet Take 100 mg by mouth daily.    [provider]  augmented betamethasone dipropionate (DIPROLENE-AF) 0.05 % cream APPLY  CREAM TOPICALLY TWICE DAILY TO AFFECTED AREA UNTIL  RESOLVED 02/18/22   Abernathy, Yetta Flock, NP  BLACK ELDERBERRY PO Take 1 each by mouth in the morning and at bedtime.    [provider]  cholecalciferol (VITAMIN D3) 25 MCG (1000 UNIT) tablet Take 1,000 Units by mouth daily.    [provider]  cyclobenzaprine (FLEXERIL) 5 MG tablet Take by mouth. 09/03/19   [provider]  ibuprofen (ADVIL) 800 MG tablet Take 1 tablet (800 mg total) by mouth every 8 (eight) hours as needed for cramping. 06/25/22   Jonetta Osgood, NP  ketoconazole (NIZORAL) 2 % shampoo WASH AFFECTED AREAS TWO-THREE TIMES A WEEK. LEAVE ON FOR 10 MINUTES BEFORE RINSING. 06/30/20   [provider]  lansoprazole (PREVACID) 30 MG capsule Take 1 capsule (30 mg  total) by mouth 2 (two) times daily before a meal. 06/25/22   Jonetta Osgood, NP  meloxicam (MOBIC) 7.5 MG tablet Take 1 tablet by mouth once daily 07/16/21   McDonough, Lauren K, PA-C  montelukast (SINGULAIR) 10 MG tablet Take 1 tablet (10 mg total) by mouth at bedtime. 11/30/21   Jonetta Osgood, NP  Multiple Vitamins-Minerals (MULTIVITAMIN WITH MINERALS) tablet Take 1 tablet by mouth daily.    [provider]  ondansetron (ZOFRAN) 4 MG tablet Take 4 mg by mouth every 8 (eight) hours as needed. 08/31/21   [provider]  Prenatal Vit-Fe Fumarate-FA (MULTIVITAMIN-PRENATAL) 27-0.8 MG TABS tablet Take 1 tablet by mouth daily at 12 noon.    [provider]  simethicone (MYLICON) 80 MG chewable tablet Chew 1 tablet (80 mg total) by mouth every 6 (six) hours as needed for flatulence. 07/25/21   Jonetta Osgood, NP  traMADol (ULTRAM) 50 MG  tablet Take 1-2 tablets (50-100 mg total) by mouth every 6 (six) hours as needed (period cramps). 06/25/22   Jonetta Osgood, NP  tranexamic acid (LYSTEDA) 650 MG TABS tablet Take 2 tablets (1,300 mg total) by mouth 3 (three) times daily. During menstrual cycle. 11/30/21   Jonetta Osgood, NP    Family History Family History  Problem Relation Age of Onset   GER disease Father    Breast cancer Maternal Aunt    Cancer Maternal Aunt    Cancer Maternal Aunt    Hypertension Maternal Uncle    Stroke Maternal Uncle    Colon cancer Maternal Grandmother    Hypertension Maternal Grandfather    Heart disease Maternal Grandfather    Kidney failure Maternal Grandfather    Hypertension Paternal Grandmother    Stroke Other    Breast cancer Other        2 maternal great aunts with breast cancer (17s) and a maternal great aunt with colon cancer (65)   Stomach cancer Other     Social History Social History   Tobacco Use   Smoking status: Never   Smokeless tobacco: Never  Vaping Use   Vaping Use: Never used  Substance Use Topics   Alcohol use: Not Currently   Drug use: Yes    Types: Marijuana    Comment: daily     Allergies   Patient has no known allergies.   Review of Systems Review of Systems  Constitutional:  Positive for fatigue.  Gastrointestinal:  Positive for nausea and vomiting. Negative for abdominal pain.     Physical Exam Triage Vital Signs ED Triage Vitals  Enc Vitals Group     BP      Pulse      Resp      Temp      Temp src      SpO2      Weight      Height      Head Circumference      Peak Flow      Pain Score      Pain Loc      Pain Edu?      Excl. in [place]?    No data found.  Updated Vital Signs BP 121/74 (BP Location: Left Arm)   Pulse 85   Temp 99.9 F (37.7 C) (Oral)   Resp 16   LMP 07/01/2022 (Approximate)   SpO2 97%   Visual Acuity Right Eye Distance:   Left Eye Distance:   Bilateral Distance:    Right Eye Near:   Left  Eye Near:     Bilateral Near:     Physical Exam Vitals and nursing note reviewed.  Constitutional:      Appearance: Normal appearance. She is not ill-appearing.  HENT:     Head: Normocephalic and atraumatic.     Mouth/Throat:     Mouth: Mucous membranes are moist.     Pharynx: Oropharynx is clear. No oropharyngeal exudate or posterior oropharyngeal erythema.     Comments: Oral mucous membranes are mildly pale as are her lips. Eyes:     Comments: Conjunctivae are mildly pale.  Cardiovascular:     Rate and Rhythm: Normal rate and regular rhythm.     Pulses: Normal pulses.     Heart sounds: Normal heart sounds. No murmur heard.    No friction rub. No gallop.  Pulmonary:     Effort: Pulmonary effort is normal.     Breath sounds: Normal breath sounds. No wheezing, rhonchi or rales.  Abdominal:     General: Abdomen is flat.     Palpations: Abdomen is soft.     Tenderness: There is no abdominal tenderness. There is no guarding or rebound.  Skin:    General: Skin is warm and dry.     Coloration: Skin is pale.  Neurological:     Mental Status: She is alert.      UC Treatments / Results  Labs (all labs ordered are listed, but only abnormal results are displayed) Labs Reviewed  URINALYSIS, ROUTINE W REFLEX MICROSCOPIC - Abnormal; Notable for the following components:      Result Value   Specific Gravity, Urine >1.030 (*)    Bilirubin Urine SMALL (*)    Ketones, ur >160 (*)    Leukocytes,Ua TRACE (*)    All other components within normal limits  COMPREHENSIVE METABOLIC PANEL - Abnormal; Notable for the following components:   Sodium 131 (*)    CO2 21 (*)    Total Protein 8.4 (*)    All other components within normal limits  URINALYSIS, MICROSCOPIC (REFLEX) - Abnormal; Notable for the following components:   Bacteria, UA FEW (*)    All other components within normal limits  CBC WITH DIFFERENTIAL/PLATELET    EKG   Radiology No results found.  Procedures Procedures (including  critical care time)  Medications Ordered in UC Medications  ondansetron (ZOFRAN-ODT) disintegrating tablet 4 mg (4 mg Oral Given 08/30/22 1320)    Initial Impression / Assessment and Plan / UC Course  I have reviewed the triage vital signs and the nursing notes.  Pertinent labs & imaging results that were available during my care of the patient were reviewed by me and considered in my medical decision making (see chart for details).   Patient is a pleasant, nontoxic-appearing 27 year old female who is G1, P0 and believes result being approximately [redacted] weeks pregnant.  She is presenting for 2 days of fatigue, vomiting, and increased saliva production.  She has a history of dysmenorrhea and menorrhagia but she denies any history of anemia.  She does have pale conjunctiva, lips, oral mucous membranes, and generally pale appearance.  I will order a CBC to evaluate for the presence of anemia.  Also a BMP and a UA to look for hydration status as she states she feels like she is dehydrated.  She has been able to keep down sips and chips.  Her oral mucous membranes are moist.She does have a history of reflux but states she is not having any abdominal pain and her  abdomen is soft and nontender on exam.  I did reassure her that she could resume taking her Prevacid for control of her acid reflux.  She is also prescribed Zofran which I have advised her that she is safe to take as well on a limited basis.  I will also prescribe her Diclegis for help with morning sickness at discharge.  CBC shows normal white count of 9.8 and normal H&H of 14.2 and 42 respectively.  Platelets are also normal at 267.  CMP shows mild hyponatremia with a sodium of 131.  CO2 is mildly decreased at 21.  Renal function and transaminases are unremarkable.  Potassium is also normal at 3.6.  Urinalysis shows high specific gravity with small bilirubin and greater than 160 ketones but is negative for protein or nitrites.  Trace leukocyte  esterase is present.  Reflex microscopy shows few bacteria and 6-10 squamous epithelials indicating a contaminated specimen.  I will discharge patient home with diagnosis of nausea in pregnancy and prescribe her Diclegis to help with nausea.  I will encourage her to push fluids.  She can use Zofran as needed to supplement for nausea.  She should keep her appointment with OB in April as scheduled.  If she develops worsening vomiting and cannot keep down fluids I will recommend she go to the emergency department.   Final Clinical Impressions(s) / UC Diagnoses   Final diagnoses:  Nausea and vomiting in pregnancy     Discharge Instructions      Your blood work and urine showed mild dehydration but nothing significant at this time.  I am going to prescribe you Diclegis to use to help combat nausea in pregnancy.  You will take it at bedtime.  Follow the dosing guidelines on the package.  You may use the Zofran to supplement the Diclegis as needed for nausea throughout the day.   You may resume using your Prevacid for control of your acid reflux.  Increase your oral fluid intake so that you improve your hydration as this is better for you and the baby.  Keep your appointment in April with your OB as scheduled.  If you develop any worsening vomiting and cannot keep down fluids despite medication you need to go to the ER for evaluation.     ED Prescriptions     Medication Sig Dispense Auth. Provider   Doxylamine-Pyridoxine 10-10 MG TBEC Take 2 tablets by mouth at bedtime. Take 2 tablets at bedtime on days 1 and 2.  If nausea persists take 1 tablet in the morning on day 3 and 2 tablets at bedtime.  If symptoms still continue you may take 1 tablet in the morning, 1 tablet mid afternoon, and 2 tablets at bedtime on day 4. 60 tablet Margarette Canada, NP      PDMP not reviewed this encounter.   Margarette Canada, NP 08/30/22 [phone]

## 2022-08-30 NOTE — Discharge Instructions (Addendum)
Your blood work and urine showed mild dehydration but nothing significant at this time.  I am going to prescribe you Diclegis to use to help combat nausea in pregnancy.  You will take it at bedtime.  Follow the dosing guidelines on the package.  You may use the Zofran to supplement the Diclegis as needed for nausea throughout the day.   You may resume using your Prevacid for control of your acid reflux.  Increase your oral fluid intake so that you improve your hydration as this is better for you and the baby.  Keep your appointment in April with your OB as scheduled.  If you develop any worsening vomiting and cannot keep down fluids despite medication you need to go to the ER for evaluation.

## 2022-09-12 ENCOUNTER — Ambulatory Visit: Payer: BLUE CROSS/BLUE SHIELD | Admitting: Certified Nurse Midwife

## 2022-10-09 LAB — PANORAMA PRENATAL TEST FULL PANEL:PANORAMA TEST PLUS 5 ADDITIONAL MICRODELETIONS: FETAL FRACTION: 18
